# Patient Record
Sex: Female | Born: 1965 | Race: White | Hispanic: No | Marital: Married | Smoking: Never smoker
Health system: Southern US, Community
[De-identification: ages and names within clinical notes are randomized; demographics above are authoritative.]

## PROBLEM LIST (undated history)

## (undated) DIAGNOSIS — I10 Essential (primary) hypertension: Secondary | ICD-10-CM

## (undated) DIAGNOSIS — M549 Dorsalgia, unspecified: Secondary | ICD-10-CM

## (undated) DIAGNOSIS — S82409A Unspecified fracture of shaft of unspecified fibula, initial encounter for closed fracture: Secondary | ICD-10-CM

## (undated) DIAGNOSIS — R519 Headache, unspecified: Secondary | ICD-10-CM

## (undated) DIAGNOSIS — D649 Anemia, unspecified: Secondary | ICD-10-CM

## (undated) DIAGNOSIS — N2889 Other specified disorders of kidney and ureter: Secondary | ICD-10-CM

## (undated) DIAGNOSIS — C649 Malignant neoplasm of unspecified kidney, except renal pelvis: Secondary | ICD-10-CM

## (undated) DIAGNOSIS — E785 Hyperlipidemia, unspecified: Secondary | ICD-10-CM

## (undated) DIAGNOSIS — G8929 Other chronic pain: Secondary | ICD-10-CM

## (undated) DIAGNOSIS — K8020 Calculus of gallbladder without cholecystitis without obstruction: Secondary | ICD-10-CM

## (undated) DIAGNOSIS — E039 Hypothyroidism, unspecified: Secondary | ICD-10-CM

## (undated) HISTORY — DX: Anemia, unspecified: D64.9

## (undated) HISTORY — DX: Morbid (severe) obesity due to excess calories: E66.01

## (undated) HISTORY — DX: Essential (primary) hypertension: I10

## (undated) HISTORY — DX: Hyperlipidemia, unspecified: E78.5

## (undated) HISTORY — DX: Malignant neoplasm of unspecified kidney, except renal pelvis: C64.9

---

## 1992-02-24 HISTORY — PX: ECTOPIC PREGNANCY SURGERY: SHX613

## 1996-02-24 HISTORY — PX: TUBAL LIGATION: SHX77

## 2008-01-30 ENCOUNTER — Other Ambulatory Visit: Admission: RE | Admit: 2008-01-30 | Discharge: 2008-01-30 | Payer: Self-pay | Admitting: Unknown Physician Specialty

## 2008-02-08 ENCOUNTER — Ambulatory Visit (HOSPITAL_COMMUNITY): Admission: RE | Admit: 2008-02-08 | Discharge: 2008-02-08 | Payer: Self-pay | Admitting: Internal Medicine

## 2009-02-19 ENCOUNTER — Other Ambulatory Visit: Admission: RE | Admit: 2009-02-19 | Discharge: 2009-02-19 | Payer: Self-pay | Admitting: Internal Medicine

## 2010-11-25 ENCOUNTER — Other Ambulatory Visit: Payer: Self-pay | Admitting: Family Medicine

## 2010-11-25 DIAGNOSIS — Z139 Encounter for screening, unspecified: Secondary | ICD-10-CM

## 2010-12-09 ENCOUNTER — Ambulatory Visit (HOSPITAL_COMMUNITY): Payer: Self-pay

## 2010-12-11 ENCOUNTER — Ambulatory Visit (HOSPITAL_COMMUNITY)
Admission: RE | Admit: 2010-12-11 | Discharge: 2010-12-11 | Disposition: A | Discharge status: Home / Self Care | Payer: BC Managed Care – PPO | Source: Ambulatory Visit | Attending: Family Medicine | Admitting: Family Medicine

## 2010-12-11 DIAGNOSIS — Z1231 Encounter for screening mammogram for malignant neoplasm of breast: Secondary | ICD-10-CM | POA: Insufficient documentation

## 2010-12-11 DIAGNOSIS — Z139 Encounter for screening, unspecified: Secondary | ICD-10-CM

## 2011-02-24 DIAGNOSIS — C649 Malignant neoplasm of unspecified kidney, except renal pelvis: Secondary | ICD-10-CM

## 2011-02-24 HISTORY — DX: Malignant neoplasm of unspecified kidney, except renal pelvis: C64.9

## 2011-04-28 ENCOUNTER — Other Ambulatory Visit: Payer: Self-pay | Admitting: Family Medicine

## 2011-05-01 ENCOUNTER — Ambulatory Visit (HOSPITAL_COMMUNITY)
Admission: RE | Admit: 2011-05-01 | Discharge: 2011-05-01 | Disposition: A | Discharge status: Home / Self Care | Payer: BC Managed Care – PPO | Source: Ambulatory Visit | Attending: Family Medicine | Admitting: Family Medicine

## 2011-05-01 DIAGNOSIS — R109 Unspecified abdominal pain: Secondary | ICD-10-CM | POA: Insufficient documentation

## 2011-05-01 DIAGNOSIS — N289 Disorder of kidney and ureter, unspecified: Secondary | ICD-10-CM | POA: Insufficient documentation

## 2011-05-01 DIAGNOSIS — K8020 Calculus of gallbladder without cholecystitis without obstruction: Secondary | ICD-10-CM | POA: Insufficient documentation

## 2011-05-01 DIAGNOSIS — R160 Hepatomegaly, not elsewhere classified: Secondary | ICD-10-CM | POA: Insufficient documentation

## 2011-05-01 DIAGNOSIS — D3000 Benign neoplasm of unspecified kidney: Secondary | ICD-10-CM | POA: Insufficient documentation

## 2011-05-01 MED ORDER — GADOBENATE DIMEGLUMINE 529 MG/ML IV SOLN: 20.0000 mL | Freq: Once | INTRAVENOUS | Status: AC | PRN

## 2011-05-13 ENCOUNTER — Ambulatory Visit (INDEPENDENT_AMBULATORY_CARE_PROVIDER_SITE_OTHER): Payer: BC Managed Care – PPO | Admitting: General Surgery

## 2011-05-13 ENCOUNTER — Encounter (INDEPENDENT_AMBULATORY_CARE_PROVIDER_SITE_OTHER): Payer: Self-pay | Admitting: General Surgery

## 2011-05-13 VITALS — BP 155/87 | HR 80 | Ht 63.0 in | Wt 272.8 lb

## 2011-05-13 DIAGNOSIS — N289 Disorder of kidney and ureter, unspecified: Secondary | ICD-10-CM

## 2011-05-13 DIAGNOSIS — N2889 Other specified disorders of kidney and ureter: Secondary | ICD-10-CM | POA: Insufficient documentation

## 2011-05-13 DIAGNOSIS — E119 Type 2 diabetes mellitus without complications: Secondary | ICD-10-CM

## 2011-05-13 DIAGNOSIS — I10 Essential (primary) hypertension: Secondary | ICD-10-CM

## 2011-05-13 DIAGNOSIS — K8020 Calculus of gallbladder without cholecystitis without obstruction: Secondary | ICD-10-CM

## 2011-05-13 DIAGNOSIS — E669 Obesity, unspecified: Secondary | ICD-10-CM | POA: Insufficient documentation

## 2011-05-13 NOTE — Progress Notes (Signed)
Patient ID: Alicia Horne, female   DOB: Apr 09, 1965, 46 y.o.   MRN: [id number]  Chief Complaint  Patient presents with  . Pre-op Exam    eval gallbladder with stones    HPI Alicia Horne is a 46 y.o. female.   HPI  She is referred by Dr. Gerda Diss for evaluation of abdominal pain, nausea, and gallstones. She has had gallstones for at least 21 years. Recently, she's had 2 episodes of pressure-type epigastric pain which radiated to her right subscapular region associated with nausea. These were self-limited. She went to the emergency department and a CT scan was performed. Gallstones were noted but there is no evidence of cholecystitis. A left kidney mass was noted. An ultrasound was performed demonstrating cholelithiasis. Common bile duct was not dilated. Liver was mildly enlarged. An MRI was performed to look at the left kidney mass. This demonstrated the gallstones as well as some fatty liver type changes. A left kidney mass was concerning for renal cell carcinoma. She is due to see a urologist for this next week.  Past Medical History  Diagnosis Date  . Anemia   . Diabetes mellitus   . Hyperlipidemia   . Hypertension     Past Surgical History  Procedure Date  . Cesarean section 1992 and 1998  . Tubal ligation 1998  . Ectopic pregnancy surgery 1994    History reviewed. No pertinent family history.  Social History History  Substance Use Topics  . Smoking status: Never Smoker   . Smokeless tobacco: Not on file  . Alcohol Use: No    No Known Allergies  Current Outpatient Prescriptions  Medication Sig Dispense Refill  . BAYER CONTOUR TEST test strip       . fish oil-omega-3 fatty acids 1000 MG capsule Take 2 g by mouth daily.      Marland Kitchen glimepiride (AMARYL) 4 MG tablet       . HYDROcodone-ibuprofen (VICOPROFEN) 7.5-200 MG per tablet       . levothyroxine (SYNTHROID, LEVOTHROID) 125 MCG tablet       . lisinopril (PRINIVIL,ZESTRIL) 20 MG tablet       . metFORMIN (GLUCOPHAGE-XR)  500 MG 24 hr tablet       . Multiple Vitamins-Minerals (MULTIVITAMIN WITH MINERALS) tablet Take 1 tablet by mouth daily.        Review of Systems Review of Systems  Constitutional: Negative.   HENT: Negative.   Respiratory: Negative.   Cardiovascular: Negative.   Gastrointestinal: Positive for nausea, abdominal pain and diarrhea.  Genitourinary: Negative.   Hematological: Negative.     Blood pressure 155/87, pulse 80, height 5\' 3"  (1.6 m), weight 272 lb 12.8 oz (123.741 kg), SpO2 97.00%.  Physical Exam Physical Exam  Constitutional:       Obese female in no acute distress.  HENT:  Head: Normocephalic and atraumatic.  Eyes: Conjunctivae are normal. No scleral icterus.  Neck: Neck supple.  Cardiovascular: Normal rate and regular rhythm.   Pulmonary/Chest: Effort normal and breath sounds normal.  Abdominal: Soft. She exhibits no distension and no mass. There is tenderness (mild in RUQ).  Musculoskeletal: She exhibits no edema.  Lymphadenopathy:    She has no cervical adenopathy.  Neurological: She is alert.  Skin: Skin is warm and dry.    Data Reviewed MRI, [place], Dr. Fletcher Anon notes.  Assessment    Symptomatic cholelithiasis. Also has a left kidney mass that is concerning for renal cell carcinoma.    Plan    We discussed  laparoscopic cholecystectomy. I told her that if she had to have surgery on her left kidney we could potentially do this at the same time.  I have explained the procedure, risks, and aftercare of cholecystectomy.  Risks include but are not limited to bleeding, infection, wound problems, anesthesia, diarrhea, bile leak, injury to common bile duct/liver/intestine.  She seems to understand.  I have asked her to call me or have a urologist call me guarding the plan for her left kidney mass.          Alicia Horne 05/13/2011, 11:21 AM

## 2011-05-13 NOTE — Patient Instructions (Signed)
Strict low-fat diet. Please let us know what you and the urologist decide regarding the kidney mass.

## 2011-05-25 ENCOUNTER — Other Ambulatory Visit (HOSPITAL_COMMUNITY): Payer: Self-pay | Admitting: Urology

## 2011-05-25 ENCOUNTER — Ambulatory Visit (HOSPITAL_COMMUNITY)
Admission: RE | Admit: 2011-05-25 | Discharge: 2011-05-25 | Disposition: A | Discharge status: Home / Self Care | Payer: BC Managed Care – PPO | Source: Ambulatory Visit | Attending: Urology | Admitting: Urology

## 2011-05-25 DIAGNOSIS — D4959 Neoplasm of unspecified behavior of other genitourinary organ: Secondary | ICD-10-CM

## 2011-05-25 DIAGNOSIS — Z01818 Encounter for other preprocedural examination: Secondary | ICD-10-CM | POA: Insufficient documentation

## 2011-05-25 DIAGNOSIS — N926 Irregular menstruation, unspecified: Secondary | ICD-10-CM | POA: Insufficient documentation

## 2011-06-01 ENCOUNTER — Other Ambulatory Visit (INDEPENDENT_AMBULATORY_CARE_PROVIDER_SITE_OTHER): Payer: Self-pay | Admitting: General Surgery

## 2011-06-03 ENCOUNTER — Other Ambulatory Visit: Payer: Self-pay | Admitting: Urology

## 2011-06-24 ENCOUNTER — Encounter (HOSPITAL_COMMUNITY): Payer: Self-pay

## 2011-06-25 ENCOUNTER — Encounter (HOSPITAL_COMMUNITY): Payer: Self-pay

## 2011-06-25 ENCOUNTER — Encounter (HOSPITAL_COMMUNITY)
Admission: RE | Admit: 2011-06-25 | Discharge: 2011-06-25 | Disposition: A | Discharge status: Home / Self Care | Payer: BC Managed Care – PPO | Source: Ambulatory Visit | Attending: Urology | Admitting: Urology

## 2011-06-25 HISTORY — DX: Dorsalgia, unspecified: M54.9

## 2011-06-25 HISTORY — DX: Other chronic pain: G89.29

## 2011-06-25 HISTORY — DX: Other specified disorders of kidney and ureter: N28.89

## 2011-06-25 HISTORY — DX: Calculus of gallbladder without cholecystitis without obstruction: K80.20

## 2011-06-25 HISTORY — DX: Hypothyroidism, unspecified: E03.9

## 2011-06-25 LAB — COMPREHENSIVE METABOLIC PANEL
Albumin: 3.4 g/dL — ABNORMAL LOW (ref 3.5–5.2)
Alkaline Phosphatase: 74 U/L (ref 39–117)
BUN: 11 mg/dL (ref 6–23)
Calcium: 9 mg/dL (ref 8.4–10.5)
Potassium: 3.8 mEq/L (ref 3.5–5.1)
Sodium: 136 mEq/L (ref 135–145)
Total Protein: 7.1 g/dL (ref 6.0–8.3)

## 2011-06-25 LAB — HCG, SERUM, QUALITATIVE: Preg, Serum: NEGATIVE

## 2011-06-25 LAB — CBC
HCT: 36.6 % (ref 36.0–46.0)
MCH: 27.3 pg (ref 26.0–34.0)
MCHC: 33.9 g/dL (ref 30.0–36.0)
RDW: 14.2 % (ref 11.5–15.5)

## 2011-06-25 LAB — PROTIME-INR: Prothrombin Time: 13.9 seconds (ref 11.6–15.2)

## 2011-06-25 NOTE — Patient Instructions (Signed)
20 Alicia Horne  06/25/2011   Your procedure is scheduled on:  07/03/11  Report to SHORT STAY DEPT  at 8:30 AM.  Call this number if you have problems the morning of surgery: [phone]   Remember:   Do not eat food or drink liquids AFTER MIDNIGHT    Take these medicines the morning of surgery with A SIP OF WATER: LEVOTHYROXINE   Do not wear jewelry, make-up or nail polish.  Do not wear lotions, powders, or perfumes.   Do not shave legs or underarms 48 hrs. before surgery (men may shave face)  Do not bring valuables to the hospital.  Contacts, dentures or bridgework may not be worn into surgery.  Leave suitcase in the car. After surgery it may be brought to your room.  For patients admitted to the hospital, checkout time is 11:00 AM the day of discharge.   Patients discharged the day of surgery will not be allowed to drive home.    Special Instructions:   Please read over the following fact sheets that you were given: MRSA  Information               SHOWER WITH BETASEPT THE NIGHT BEFORE SURGERY AND THE MORNING OF SURGERY

## 2011-07-02 ENCOUNTER — Encounter (INDEPENDENT_AMBULATORY_CARE_PROVIDER_SITE_OTHER): Payer: Self-pay

## 2011-07-03 ENCOUNTER — Encounter (HOSPITAL_COMMUNITY): Payer: Self-pay | Admitting: *Deleted

## 2011-07-03 ENCOUNTER — Encounter (HOSPITAL_COMMUNITY)
Admission: RE | Disposition: A | Discharge status: Home / Self Care | Payer: Self-pay | Source: Ambulatory Visit | Attending: Urology

## 2011-07-03 ENCOUNTER — Inpatient Hospital Stay (HOSPITAL_COMMUNITY)
Admission: RE | Admit: 2011-07-03 | Discharge: 2011-07-07 | DRG: 303 | Disposition: A | Discharge status: Home / Self Care | Payer: BC Managed Care – PPO | Source: Ambulatory Visit | Attending: Urology | Admitting: Urology

## 2011-07-03 ENCOUNTER — Encounter (HOSPITAL_COMMUNITY): Payer: Self-pay | Admitting: Anesthesiology

## 2011-07-03 ENCOUNTER — Ambulatory Visit (HOSPITAL_COMMUNITY): Payer: BC Managed Care – PPO

## 2011-07-03 ENCOUNTER — Ambulatory Visit (HOSPITAL_COMMUNITY): Payer: BC Managed Care – PPO | Admitting: Anesthesiology

## 2011-07-03 DIAGNOSIS — K7689 Other specified diseases of liver: Secondary | ICD-10-CM | POA: Diagnosis present

## 2011-07-03 DIAGNOSIS — E039 Hypothyroidism, unspecified: Secondary | ICD-10-CM | POA: Diagnosis present

## 2011-07-03 DIAGNOSIS — B373 Candidiasis of vulva and vagina: Secondary | ICD-10-CM | POA: Diagnosis not present

## 2011-07-03 DIAGNOSIS — E785 Hyperlipidemia, unspecified: Secondary | ICD-10-CM | POA: Diagnosis present

## 2011-07-03 DIAGNOSIS — N2889 Other specified disorders of kidney and ureter: Secondary | ICD-10-CM | POA: Diagnosis present

## 2011-07-03 DIAGNOSIS — B3731 Acute candidiasis of vulva and vagina: Secondary | ICD-10-CM | POA: Diagnosis not present

## 2011-07-03 DIAGNOSIS — E119 Type 2 diabetes mellitus without complications: Secondary | ICD-10-CM | POA: Diagnosis present

## 2011-07-03 DIAGNOSIS — B009 Herpesviral infection, unspecified: Secondary | ICD-10-CM | POA: Diagnosis not present

## 2011-07-03 DIAGNOSIS — K560 Paralytic ileus: Secondary | ICD-10-CM | POA: Diagnosis not present

## 2011-07-03 DIAGNOSIS — I10 Essential (primary) hypertension: Secondary | ICD-10-CM | POA: Diagnosis present

## 2011-07-03 DIAGNOSIS — K8020 Calculus of gallbladder without cholecystitis without obstruction: Secondary | ICD-10-CM | POA: Diagnosis present

## 2011-07-03 DIAGNOSIS — K660 Peritoneal adhesions (postprocedural) (postinfection): Secondary | ICD-10-CM | POA: Diagnosis present

## 2011-07-03 DIAGNOSIS — C649 Malignant neoplasm of unspecified kidney, except renal pelvis: Principal | ICD-10-CM | POA: Diagnosis present

## 2011-07-03 DIAGNOSIS — K8010 Calculus of gallbladder with chronic cholecystitis without obstruction: Secondary | ICD-10-CM

## 2011-07-03 HISTORY — PX: PARTIAL NEPHRECTOMY: SHX414

## 2011-07-03 HISTORY — PX: CHOLECYSTECTOMY: SHX55

## 2011-07-03 LAB — TYPE AND SCREEN
ABO/RH(D): A POS
Antibody Screen: NEGATIVE

## 2011-07-03 LAB — BASIC METABOLIC PANEL
BUN: 10 mg/dL (ref 6–23)
CO2: 21 mEq/L (ref 19–32)
Calcium: 8.3 mg/dL — ABNORMAL LOW (ref 8.4–10.5)
Creatinine, Ser: 0.77 mg/dL (ref 0.50–1.10)

## 2011-07-03 LAB — GLUCOSE, CAPILLARY: Glucose-Capillary: 228 mg/dL — ABNORMAL HIGH (ref 70–99)

## 2011-07-03 LAB — HEMOGLOBIN AND HEMATOCRIT, BLOOD: Hemoglobin: 12 g/dL (ref 12.0–15.0)

## 2011-07-03 SURGERY — ROBOTIC ASSITED PARTIAL NEPHRECTOMY
Anesthesia: General | Site: Abdomen | Wound class: Clean Contaminated

## 2011-07-03 MED ORDER — SIMVASTATIN 20 MG PO TABS
20.0000 mg | ORAL_TABLET | Freq: Every day | ORAL | Status: DC
  Administered 2011-07-04 – 2011-07-06 (×3): 20 mg via ORAL
  Filled 2011-07-03 (×4): qty 1

## 2011-07-03 MED ORDER — NEOSTIGMINE METHYLSULFATE 1 MG/ML IJ SOLN
INTRAMUSCULAR | Status: DC | PRN
  Administered 2011-07-03: 5 mg via INTRAVENOUS

## 2011-07-03 MED ORDER — GLIMEPIRIDE 4 MG PO TABS
4.0000 mg | ORAL_TABLET | Freq: Every day | ORAL | Status: DC
  Administered 2011-07-04 – 2011-07-07 (×4): 4 mg via ORAL
  Filled 2011-07-03 (×7): qty 1

## 2011-07-03 MED ORDER — BUPIVACAINE-EPINEPHRINE 0.25% -1:200000 IJ SOLN
INTRAMUSCULAR | Status: AC
  Filled 2011-07-03: qty 1

## 2011-07-03 MED ORDER — INSULIN ASPART 100 UNIT/ML ~~LOC~~ SOLN
SUBCUTANEOUS | Status: AC
  Administered 2011-07-03: 5 [IU] via SUBCUTANEOUS
  Filled 2011-07-03: qty 1

## 2011-07-03 MED ORDER — METFORMIN HCL ER 500 MG PO TB24
500.0000 mg | ORAL_TABLET | Freq: Every day | ORAL | Status: DC
  Administered 2011-07-04 – 2011-07-07 (×4): 500 mg via ORAL
  Filled 2011-07-03 (×5): qty 1

## 2011-07-03 MED ORDER — SODIUM CHLORIDE 0.9 % IV SOLN
INTRAVENOUS | Status: DC
  Administered 2011-07-03 – 2011-07-07 (×4): via INTRAVENOUS

## 2011-07-03 MED ORDER — CISATRACURIUM BESYLATE (PF) 10 MG/5ML IV SOLN
INTRAVENOUS | Status: DC | PRN
  Administered 2011-07-03 (×2): 4 mg via INTRAVENOUS
  Administered 2011-07-03: 8 mg via INTRAVENOUS
  Administered 2011-07-03: 4 mg via INTRAVENOUS
  Administered 2011-07-03: 10 mg via INTRAVENOUS
  Administered 2011-07-03: 12 mg via INTRAVENOUS
  Administered 2011-07-03: 8 mg via INTRAVENOUS
  Administered 2011-07-03: 4 mg via INTRAVENOUS

## 2011-07-03 MED ORDER — MORPHINE SULFATE 2 MG/ML IJ SOLN
2.0000 mg | INTRAMUSCULAR | Status: DC | PRN
  Administered 2011-07-03: 2 mg via INTRAVENOUS
  Administered 2011-07-04 – 2011-07-05 (×14): 4 mg via INTRAVENOUS
  Administered 2011-07-06: 2 mg via INTRAVENOUS
  Administered 2011-07-06: 4 mg via INTRAVENOUS
  Filled 2011-07-03 (×10): qty 2
  Filled 2011-07-03: qty 1
  Filled 2011-07-03: qty 2
  Filled 2011-07-03: qty 1
  Filled 2011-07-03 (×2): qty 2
  Filled 2011-07-03: qty 1
  Filled 2011-07-03: qty 2
  Filled 2011-07-03: qty 1
  Filled 2011-07-03: qty 2

## 2011-07-03 MED ORDER — BUPIVACAINE LIPOSOME 1.3 % IJ SUSP
20.0000 mL | INTRAMUSCULAR | Status: AC
  Administered 2011-07-03: 20 mL
  Filled 2011-07-03: qty 20

## 2011-07-03 MED ORDER — PROPOFOL 10 MG/ML IV EMUL
INTRAVENOUS | Status: DC | PRN
  Administered 2011-07-03: 50 mg via INTRAVENOUS
  Administered 2011-07-03: 150 mg via INTRAVENOUS

## 2011-07-03 MED ORDER — ONDANSETRON HCL 4 MG/2ML IJ SOLN
INTRAMUSCULAR | Status: DC | PRN
  Administered 2011-07-03: 4 mg via INTRAVENOUS

## 2011-07-03 MED ORDER — BUPIVACAINE-EPINEPHRINE 0.25% -1:200000 IJ SOLN
INTRAMUSCULAR | Status: DC | PRN
  Administered 2011-07-03: 20 mL

## 2011-07-03 MED ORDER — FIBRIN SEALANT COMPONENT 5 ML EX KIT
PACK | CUTANEOUS | Status: AC
  Filled 2011-07-03: qty 2

## 2011-07-03 MED ORDER — ONDANSETRON HCL 4 MG/2ML IJ SOLN
4.0000 mg | INTRAMUSCULAR | Status: DC | PRN
  Administered 2011-07-03 – 2011-07-05 (×6): 4 mg via INTRAVENOUS
  Filled 2011-07-03 (×6): qty 2

## 2011-07-03 MED ORDER — MANNITOL 25 % IV SOLN
INTRAVENOUS | Status: DC | PRN
  Administered 2011-07-03 (×2): 12.5 g via INTRAVENOUS

## 2011-07-03 MED ORDER — LACTATED RINGERS IV SOLN: INTRAVENOUS | Status: DC

## 2011-07-03 MED ORDER — ACETAMINOPHEN 10 MG/ML IV SOLN
1000.0000 mg | Freq: Four times a day (QID) | INTRAVENOUS | Status: AC
  Administered 2011-07-04 (×4): 1000 mg via INTRAVENOUS
  Filled 2011-07-03 (×4): qty 100

## 2011-07-03 MED ORDER — HYDROMORPHONE HCL PF 1 MG/ML IJ SOLN: 0.2500 mg | INTRAMUSCULAR | Status: DC | PRN

## 2011-07-03 MED ORDER — LIDOCAINE HCL (CARDIAC) 20 MG/ML IV SOLN
INTRAVENOUS | Status: DC | PRN
  Administered 2011-07-03: 100 mg via INTRAVENOUS

## 2011-07-03 MED ORDER — CEFAZOLIN SODIUM 1-5 GM-% IV SOLN
1.0000 g | Freq: Three times a day (TID) | INTRAVENOUS | Status: AC
  Administered 2011-07-03 – 2011-07-04 (×2): 1 g via INTRAVENOUS
  Filled 2011-07-03 (×2): qty 50

## 2011-07-03 MED ORDER — INDIGOTINDISULFONATE SODIUM 8 MG/ML IJ SOLN
INTRAMUSCULAR | Status: AC
  Filled 2011-07-03: qty 5

## 2011-07-03 MED ORDER — FENTANYL CITRATE 0.05 MG/ML IJ SOLN
INTRAMUSCULAR | Status: DC | PRN
  Administered 2011-07-03 (×3): 50 ug via INTRAVENOUS
  Administered 2011-07-03: 100 ug via INTRAVENOUS
  Administered 2011-07-03 (×2): 50 ug via INTRAVENOUS

## 2011-07-03 MED ORDER — HYDRALAZINE HCL 20 MG/ML IJ SOLN
INTRAMUSCULAR | Status: DC | PRN
  Administered 2011-07-03 (×2): 4 mg via INTRAVENOUS

## 2011-07-03 MED ORDER — CEFAZOLIN SODIUM-DEXTROSE 2-3 GM-% IV SOLR
INTRAVENOUS | Status: AC
  Filled 2011-07-03: qty 50

## 2011-07-03 MED ORDER — LABETALOL HCL 5 MG/ML IV SOLN
INTRAVENOUS | Status: DC | PRN
  Administered 2011-07-03: 5 mg via INTRAVENOUS

## 2011-07-03 MED ORDER — LACTATED RINGERS IV SOLN
INTRAVENOUS | Status: DC | PRN
  Administered 2011-07-03 (×3): via INTRAVENOUS

## 2011-07-03 MED ORDER — ESMOLOL HCL 10 MG/ML IV SOLN
INTRAVENOUS | Status: DC | PRN
  Administered 2011-07-03: 40 mg via INTRAVENOUS
  Administered 2011-07-03 (×2): 20 mg via INTRAVENOUS

## 2011-07-03 MED ORDER — MANNITOL 25 % IV SOLN
INTRAVENOUS | Status: AC
  Filled 2011-07-03: qty 100

## 2011-07-03 MED ORDER — LEVOTHYROXINE SODIUM 125 MCG PO TABS
125.0000 ug | ORAL_TABLET | Freq: Every day | ORAL | Status: DC
  Administered 2011-07-04 – 2011-07-07 (×4): 125 ug via ORAL
  Filled 2011-07-03 (×5): qty 1

## 2011-07-03 MED ORDER — IOHEXOL 300 MG/ML  SOLN
INTRAMUSCULAR | Status: AC
  Filled 2011-07-03: qty 1

## 2011-07-03 MED ORDER — INSULIN ASPART 100 UNIT/ML ~~LOC~~ SOLN: 0.0000 [IU] | SUBCUTANEOUS | Status: DC

## 2011-07-03 MED ORDER — INSULIN ASPART 100 UNIT/ML ~~LOC~~ SOLN
SUBCUTANEOUS | Status: AC
  Filled 2011-07-03: qty 1

## 2011-07-03 MED ORDER — SENNOSIDES-DOCUSATE SODIUM 8.6-50 MG PO TABS
2.0000 | ORAL_TABLET | Freq: Every day | ORAL | Status: DC
  Administered 2011-07-04 – 2011-07-05 (×2): 2 via ORAL
  Filled 2011-07-03 (×4): qty 2

## 2011-07-03 MED ORDER — CEFAZOLIN SODIUM-DEXTROSE 2-3 GM-% IV SOLR
2.0000 g | INTRAVENOUS | Status: AC
  Administered 2011-07-03 (×2): 2 g via INTRAVENOUS

## 2011-07-03 MED ORDER — HYDROMORPHONE HCL PF 1 MG/ML IJ SOLN
INTRAMUSCULAR | Status: DC | PRN
  Administered 2011-07-03: 1 mg via INTRAVENOUS
  Administered 2011-07-03 (×3): 0.5 mg via INTRAVENOUS
  Administered 2011-07-03: 1 mg via INTRAVENOUS
  Administered 2011-07-03: 0.5 mg via INTRAVENOUS

## 2011-07-03 MED ORDER — MIDAZOLAM HCL 5 MG/5ML IJ SOLN
INTRAMUSCULAR | Status: DC | PRN
  Administered 2011-07-03: 2 mg via INTRAVENOUS

## 2011-07-03 MED ORDER — SUFENTANIL CITRATE 50 MCG/ML IV SOLN
INTRAVENOUS | Status: DC | PRN
  Administered 2011-07-03: 15 ug via INTRAVENOUS
  Administered 2011-07-03: 5 ug via INTRAVENOUS
  Administered 2011-07-03: 10 ug via INTRAVENOUS
  Administered 2011-07-03: 5 ug via INTRAVENOUS
  Administered 2011-07-03: 10 ug via INTRAVENOUS
  Administered 2011-07-03: 5 ug via INTRAVENOUS

## 2011-07-03 MED ORDER — LACTATED RINGERS IR SOLN
Status: DC | PRN
  Administered 2011-07-03: 2000 mL

## 2011-07-03 MED ORDER — INSULIN ASPART 100 UNIT/ML ~~LOC~~ SOLN
0.0000 [IU] | SUBCUTANEOUS | Status: DC
  Administered 2011-07-04: 4 [IU] via SUBCUTANEOUS
  Administered 2011-07-04: 3 [IU] via SUBCUTANEOUS
  Administered 2011-07-04: 4 [IU] via SUBCUTANEOUS
  Administered 2011-07-04: 11 [IU] via SUBCUTANEOUS
  Administered 2011-07-04: 2 [IU] via SUBCUTANEOUS
  Administered 2011-07-05 (×2): 4 [IU] via SUBCUTANEOUS
  Administered 2011-07-05: 3 [IU] via SUBCUTANEOUS
  Administered 2011-07-05 (×3): 4 [IU] via SUBCUTANEOUS
  Administered 2011-07-06 (×3): 3 [IU] via SUBCUTANEOUS
  Administered 2011-07-06 (×2): 4 [IU] via SUBCUTANEOUS
  Administered 2011-07-07 (×2): 3 [IU] via SUBCUTANEOUS

## 2011-07-03 MED ORDER — INSULIN ASPART 100 UNIT/ML ~~LOC~~ SOLN
0.0000 [IU] | SUBCUTANEOUS | Status: DC
  Administered 2011-07-03: 5 [IU] via SUBCUTANEOUS
  Administered 2011-07-03: 8 [IU] via SUBCUTANEOUS

## 2011-07-03 MED ORDER — HYDROCODONE-ACETAMINOPHEN 5-325 MG PO TABS
1.0000 | ORAL_TABLET | ORAL | Status: DC | PRN
  Administered 2011-07-05: 2 via ORAL
  Filled 2011-07-03: qty 2

## 2011-07-03 MED ORDER — GLYCOPYRROLATE 0.2 MG/ML IJ SOLN
INTRAMUSCULAR | Status: DC | PRN
  Administered 2011-07-03: .7 mg via INTRAVENOUS

## 2011-07-03 MED ORDER — IOHEXOL 300 MG/ML  SOLN
INTRAMUSCULAR | Status: DC | PRN
  Administered 2011-07-03: 20 mL

## 2011-07-03 SURGICAL SUPPLY — 116 items
ADH SKN CLS APL DERMABOND .7 (GAUZE/BANDAGES/DRESSINGS) ×2
APL ESCP 34 STRL LF DISP (HEMOSTASIS) ×2
APL SKNCLS STERI-STRIP NONHPOA (GAUZE/BANDAGES/DRESSINGS)
APPLICATOR SURGIFLO ENDO (HEMOSTASIS) ×1 IMPLANT
APPLIER CLIP 5 13 M/L LIGAMAX5 (MISCELLANEOUS) ×9
APPLIER CLIP ROT 10 11.4 M/L (STAPLE)
APR CLP MED LRG 11.4X10 (STAPLE)
APR CLP MED LRG 5 ANG JAW (MISCELLANEOUS) ×6
BAG SPEC RTRVL LRG 6X4 10 (ENDOMECHANICALS) ×4
BENZOIN TINCTURE PRP APPL 2/3 (GAUZE/BANDAGES/DRESSINGS) ×2 IMPLANT
CABLE HIGH FREQUENCY MONO STRZ (ELECTRODE) ×1 IMPLANT
CANISTER SUCTION 2500CC (MISCELLANEOUS) ×3 IMPLANT
CANNULA SEAL DVNC (CANNULA) ×6 IMPLANT
CANNULA SEALS DA VINCI (CANNULA) ×3
CHLORAPREP W/TINT 26ML (MISCELLANEOUS) ×7 IMPLANT
CLIP APPLIE 5 13 M/L LIGAMAX5 (MISCELLANEOUS) ×2 IMPLANT
CLIP APPLIE ROT 10 11.4 M/L (STAPLE) IMPLANT
CLIP LIGATING HEM O LOK PURPLE (MISCELLANEOUS) IMPLANT
CLIP LIGATING HEMO O LOK GREEN (MISCELLANEOUS) IMPLANT
CLIP SUT LAPRA TY ABSORB (SUTURE) IMPLANT
CLOTH BEACON ORANGE TIMEOUT ST (SAFETY) ×5 IMPLANT
CORD HIGH FREQUENCY UNIPOLAR (ELECTROSURGICAL) ×2 IMPLANT
CORDS BIPOLAR (ELECTRODE) ×3 IMPLANT
COVER MAYO STAND STRL (DRAPES) ×3 IMPLANT
COVER SURGICAL LIGHT HANDLE (MISCELLANEOUS) ×4 IMPLANT
COVER TIP SHEARS 8 DVNC (MISCELLANEOUS) ×2 IMPLANT
COVER TIP SHEARS 8MM DA VINCI (MISCELLANEOUS) ×1
DECANTER SPIKE VIAL GLASS SM (MISCELLANEOUS) ×4 IMPLANT
DERMABOND ADVANCED (GAUZE/BANDAGES/DRESSINGS) ×1
DERMABOND ADVANCED .7 DNX12 (GAUZE/BANDAGES/DRESSINGS) ×4 IMPLANT
DISSECTOR BLUNT TIP ENDO 5MM (MISCELLANEOUS) ×1 IMPLANT
DRAIN CHANNEL 15F RND FF 3/16 (WOUND CARE) ×3 IMPLANT
DRAPE C-ARM 42X72 X-RAY (DRAPES) ×1 IMPLANT
DRAPE CAMERA CLOSED 9X96 (DRAPES) ×1 IMPLANT
DRAPE INCISE IOBAN 66X45 STRL (DRAPES) ×4 IMPLANT
DRAPE LAPAROSCOPIC ABDOMINAL (DRAPES) ×6 IMPLANT
DRAPE LG THREE QUARTER DISP (DRAPES) ×6 IMPLANT
DRAPE TABLE BACK 44X90 PK DISP (DRAPES) ×3 IMPLANT
DRAPE UTILITY XL STRL (DRAPES) ×3 IMPLANT
DRAPE WARM FLUID 44X44 (DRAPE) ×3 IMPLANT
DRESSING SURGICEL FIBRLLR 1X2 (HEMOSTASIS) ×2 IMPLANT
DRSG SURGICEL FIBRILLAR 1X2 (HEMOSTASIS)
DRSG TEGADERM 2-3/8X2-3/4 SM (GAUZE/BANDAGES/DRESSINGS) ×13 IMPLANT
DRSG TEGADERM 4X4.75 (GAUZE/BANDAGES/DRESSINGS) ×2 IMPLANT
ELECT REM PT RETURN 9FT ADLT (ELECTROSURGICAL) ×6
ELECTRODE REM PT RTRN 9FT ADLT (ELECTROSURGICAL) ×6 IMPLANT
ENDOLOOP SUT PDS II  0 18 (SUTURE)
ENDOLOOP SUT PDS II 0 18 (SUTURE) IMPLANT
EVACUATOR SILICONE 100CC (DRAIN) ×3 IMPLANT
FLOSEAL 10ML (HEMOSTASIS) ×1 IMPLANT
GAUZE VASELINE 3X9 (GAUZE/BANDAGES/DRESSINGS) IMPLANT
GLOVE BIO SURGEON STRL SZ 6.5 (GLOVE) ×3 IMPLANT
GLOVE BIOGEL M STRL SZ7.5 (GLOVE) ×10 IMPLANT
GLOVE BIOGEL PI IND STRL 7.0 (GLOVE) ×2 IMPLANT
GLOVE BIOGEL PI IND STRL 8.5 (GLOVE) IMPLANT
GLOVE BIOGEL PI INDICATOR 7.0 (GLOVE) ×2
GLOVE BIOGEL PI INDICATOR 8.5 (GLOVE) ×2
GLOVE ECLIPSE 8.0 STRL XLNG CF (GLOVE) ×2 IMPLANT
GLOVE INDICATOR 8.0 STRL GRN (GLOVE) ×4 IMPLANT
GLOVE SURG SS PI 6.5 STRL IVOR (GLOVE) ×4 IMPLANT
GOWN STRL NON-REIN LRG LVL3 (GOWN DISPOSABLE) ×22 IMPLANT
GOWN STRL REIN XL XLG (GOWN DISPOSABLE) ×9 IMPLANT
HEMOSTAT SNOW SURGICEL 2X4 (HEMOSTASIS) ×1 IMPLANT
HEMOSTAT SURGICEL 4X8 (HEMOSTASIS) IMPLANT
IV CATH 14GX2 1/4 (CATHETERS) IMPLANT
KIT ACCESSORY DA VINCI DISP (KITS) ×1
KIT ACCESSORY DVNC DISP (KITS) ×2 IMPLANT
KIT BASIN OR (CUSTOM PROCEDURE TRAY) ×5 IMPLANT
NDL SAFETY ECLIPSE 18X1.5 (NEEDLE) IMPLANT
NEEDLE HYPO 18GX1.5 SHARP (NEEDLE) ×3
NS IRRIG 1000ML POUR BTL (IV SOLUTION) ×3 IMPLANT
PENCIL BUTTON HOLSTER BLD 10FT (ELECTRODE) ×3 IMPLANT
POSITIONER SURGICAL ARM (MISCELLANEOUS) ×4 IMPLANT
POUCH SPECIMEN RETRIEVAL 10MM (ENDOMECHANICALS) ×4 IMPLANT
SCISSORS ENDO CVD 5DCS (MISCELLANEOUS) ×1 IMPLANT
SET CHOLANGIOGRAPH MIX (MISCELLANEOUS) ×3 IMPLANT
SET IRRIG TUBING LAPAROSCOPIC (IRRIGATION / IRRIGATOR) ×3 IMPLANT
SET TUBE IRRIG SUCTION NO TIP (IRRIGATION / IRRIGATOR) ×1 IMPLANT
SOLUTION ANTI FOG 6CC (MISCELLANEOUS) ×5 IMPLANT
SOLUTION ELECTROLUBE (MISCELLANEOUS) ×3 IMPLANT
SPONGE GAUZE 4X4 12PLY (GAUZE/BANDAGES/DRESSINGS) ×1 IMPLANT
SPONGE LAP 18X18 X RAY DECT (DISPOSABLE) ×2 IMPLANT
STAPLER VISISTAT 35W (STAPLE) ×2 IMPLANT
STRIP CLOSURE SKIN 1/2X4 (GAUZE/BANDAGES/DRESSINGS) ×2 IMPLANT
SURGIFLO W/THROMBIN 8M KIT (HEMOSTASIS) ×2 IMPLANT
SUT ETHILON 3 0 PS 1 (SUTURE) ×3 IMPLANT
SUT MNCRL AB 4-0 PS2 18 (SUTURE) ×10 IMPLANT
SUT MON AB 2-0 SH 27 (SUTURE) ×2 IMPLANT
SUT MON AB 2-0 SH27 (SUTURE) ×2 IMPLANT
SUT V-LOC BARB 180 2/0GR9 GS23 (SUTURE) ×6
SUT VIC AB 0 CT1 27 (SUTURE) ×3
SUT VIC AB 0 CT1 27XBRD ANTBC (SUTURE) ×4 IMPLANT
SUT VIC AB 0 UR5 27 (SUTURE) ×2 IMPLANT
SUT VIC AB 2-0 SH 27 (SUTURE)
SUT VIC AB 2-0 SH 27X BRD (SUTURE) ×4 IMPLANT
SUT VIC AB 4-0 RB1 27 (SUTURE)
SUT VIC AB 4-0 RB1 27XBRD (SUTURE) ×4 IMPLANT
SUT VICRYL 0 UR6 27IN ABS (SUTURE) ×7 IMPLANT
SUT VLOC BARB 180 ABS3/0GR12 (SUTURE) ×3
SUTURE V-LC BRB 180 2/0GR9GS23 (SUTURE) IMPLANT
SUTURE VLOC BRB 180 ABS3/0GR12 (SUTURE) IMPLANT
SYR 3ML LL SCALE MARK (SYRINGE) ×1 IMPLANT
SYR BULB IRRIGATION 50ML (SYRINGE) IMPLANT
TOWEL OR 17X26 10 PK STRL BLUE (TOWEL DISPOSABLE) ×4 IMPLANT
TOWEL OR NON WOVEN STRL DISP B (DISPOSABLE) ×3 IMPLANT
TRAY FOLEY CATH 14FRSI W/METER (CATHETERS) ×3 IMPLANT
TRAY LAP CHOLE (CUSTOM PROCEDURE TRAY) ×5 IMPLANT
TROCAR BLADELESS OPT 5 100 (ENDOMECHANICALS) ×1 IMPLANT
TROCAR BLADELESS OPT 5 75 (ENDOMECHANICALS) ×9 IMPLANT
TROCAR ENDOPATH XCEL 12X100 BL (ENDOMECHANICALS) ×3 IMPLANT
TROCAR XCEL 12X100 BLDLESS (ENDOMECHANICALS) ×4 IMPLANT
TROCAR XCEL BLUNT TIP 100MML (ENDOMECHANICALS) ×3 IMPLANT
TROCAR XCEL NON-BLD 11X100MML (ENDOMECHANICALS) IMPLANT
TROCAR XCEL NON-BLD 5MMX100MML (ENDOMECHANICALS) ×1 IMPLANT
TUBING INSUFFLATION 10FT LAP (TUBING) ×6 IMPLANT
WATER STERILE IRR 1500ML POUR (IV SOLUTION) ×6 IMPLANT

## 2011-07-03 NOTE — Transfer of Care (Signed)
Immediate Anesthesia Transfer of Care Note  Patient: Alicia Horne  Procedure(s) Performed: Procedure(s) (LRB): ROBOTIC ASSITED PARTIAL NEPHRECTOMY (Left) LAPAROSCOPIC CHOLECYSTECTOMY WITH INTRAOPERATIVE CHOLANGIOGRAM (N/A) OPERATIVE ULTRASOUND (Left)  Patient Location: PACU  Anesthesia Type: General  Level of Consciousness: sedated, patient cooperative and responds to stimulaton  Airway & Oxygen Therapy: Patient Spontanous Breathing and Patient connected to face mask oxgen  Post-op Assessment: Report given to PACU RN and Post -op Vital signs reviewed and stable  Post vital signs: Reviewed and stable  Complications: No apparent anesthesia complications

## 2011-07-03 NOTE — Anesthesia Postprocedure Evaluation (Signed)
  Anesthesia Post-op Note  Patient: Alicia Horne  Procedure(s) Performed: Procedure(s) (LRB): ROBOTIC ASSITED PARTIAL NEPHRECTOMY (Left) LAPAROSCOPIC CHOLECYSTECTOMY WITH INTRAOPERATIVE CHOLANGIOGRAM (N/A) OPERATIVE ULTRASOUND (Left)  Patient Location: PACU  Anesthesia Type: General  Level of Consciousness: awake, alert  and oriented  Airway and Oxygen Therapy: Patient Spontanous Breathing and Patient connected to face mask oxygen  Post-op Pain: none  Post-op Assessment: Post-op Vital signs reviewed, Patient's Cardiovascular Status Stable, Respiratory Function Stable, Patent Airway, No signs of Nausea or vomiting and Pain level controlled  Post-op Vital Signs: Reviewed and stable  Complications: No apparent anesthesia complications

## 2011-07-03 NOTE — Anesthesia Postprocedure Evaluation (Signed)
  Anesthesia Post-op Note  Patient: Alicia Horne  Procedure(s) Performed: Procedure(s) (LRB): ROBOTIC ASSITED PARTIAL NEPHRECTOMY (Left) LAPAROSCOPIC CHOLECYSTECTOMY WITH INTRAOPERATIVE CHOLANGIOGRAM (N/A) OPERATIVE ULTRASOUND (Left)  Patient Location: PACU  Anesthesia Type: General  Level of Consciousness: oriented and sedated  Airway and Oxygen Therapy: Patient Spontanous Breathing and Patient connected to nasal cannula oxygen  Post-op Pain: mild  Post-op Assessment: Post-op Vital signs reviewed, Patient's Cardiovascular Status Stable, Respiratory Function Stable and Patent Airway  Post-op Vital Signs: stable  Complications: No apparent anesthesia complications

## 2011-07-03 NOTE — Progress Notes (Signed)
Pt did bowel prep and clear liq diet 07/02/11

## 2011-07-03 NOTE — Anesthesia Preprocedure Evaluation (Addendum)
Anesthesia Evaluation  Patient identified by MRN, date of birth, ID band Patient awake    Reviewed: Allergy & Precautions, H&P , NPO status , Patient's Chart, lab work & pertinent test results, reviewed documented beta blocker date and time   Airway Mallampati: II TM Distance: >3 FB Neck ROM: Full    Dental  (+) Dental Advisory Given and Teeth Intact   Pulmonary neg pulmonary ROS,  breath sounds clear to auscultation        Cardiovascular hypertension, Pt. on medications Rhythm:Regular Rate:Normal  Denies cardiac sumptoms   Neuro/Psych negative neurological ROS  negative psych ROS   GI/Hepatic Neg liver ROS, gallstones   Endo/Other  Diabetes mellitus-, Poorly Controlled, Type 2, Oral Hypoglycemic AgentsHypothyroidism Morbid obesity  Renal/GU Lf renal mass  negative genitourinary   Musculoskeletal negative musculoskeletal ROS (+)   Abdominal   Peds negative pediatric ROS (+)  Hematology negative hematology ROS (+)   Anesthesia Other Findings   Reproductive/Obstetrics negative OB ROS                          Anesthesia Physical Anesthesia Plan  ASA: III  Anesthesia Plan: General   Post-op Pain Management:    Induction: Intravenous  Airway Management Planned: Oral ETT  Additional Equipment:   Intra-op Plan:   Post-operative Plan: Extubation in OR  Informed Consent: I have reviewed the patients History and Physical, chart, labs and discussed the procedure including the risks, benefits and alternatives for the proposed anesthesia with the patient or authorized representative who has indicated his/her understanding and acceptance.   Dental advisory given  Plan Discussed with: CRNA and Surgeon  Anesthesia Plan Comments:         Anesthesia Quick Evaluation

## 2011-07-03 NOTE — Op Note (Signed)
Preoperative diagnosis:  Symptomatic cholelithiasis Postoperative diagnosis:  Same  Procedure: Laparoscopic cholecystectomy with cholangiogram.  Surgeon: Avel Peace, M.D.  Asst.:  none  Anesthesia: Gen.  Indication:   This is a 46 year old female with known gallstones. Recently she started having fairly classic biliary colic type pain. A CT scan was done which demonstrated gallstones also demonstrated a left kidney tumor. She now presents for laparoscopic cholecystectomy followed by a robotic-assisted partial left nephrectomy by Dr. Margarita Grizzle.  Technique: The patient was brought to the operating room, placed supine on the operating table, and a general anesthetic was administered.  The abdominal wall was then sterilely prepped and draped. Local anesthetic (Marcaine) was infiltrated in the supraumbilical region. A small supraumbilical incision was made through the skin, subcutaneous tissue, fascia, and peritoneum entering the peritoneal cavity under direct vision. A pursestring suture of 0 Vicryl was placed around the edges of the fascia. A Hassan trocar was introduced into the peritoneal cavity and a pneumoperitoneum was created by insufflation of carbon dioxide gas. The laparoscope was introduced into the trocar and no underlying bleeding or organ injury was noted. The patient was then placed in the reverse Trendelenburg position with the right side tilted slightly up.  Three more trochars were then placed into the abdominal cavity under laparoscopic vision. One in the epigastric area, and 2 in the right upper quadrant area. The gallbladder was visualized.  There were adhesions between the omentum and gallbladder which were separated bluntly. The liver demonstrated diffuse fatty infiltration changes.  The fundus was grasped and retracted toward the right shoulder.  The infundibulum was mobilized with dissection close to the gallbladder and retracted laterally. The cystic duct was identified and a  window was created around it. The cystic artery was also identified and a window was created around it. The critical view was achieved. A clip was placed at the neck of the gallbladder. A small incision was made in the cystic duct any small stone fragment was removed from the cystic duct.. A cholangiocatheter was introduced through the anterior abdominal wall and placed in the cystic duct. A intraoperative cholangiogram was then performed.  Under real-time fluoroscopy, dilute contrast was injected into the cystic duct.  The common hepatic duct, the right and left hepatic ducts, and the common duct were all visualized. Contrast drained into the duodenum without obvious evidence of any obstructing ductal lesion. The final report is pending the Radiologist's interpretation.  The cholangiocatheter was removed, the cystic duct was clipped 3 times on the biliary side, and then the cystic duct was divided sharply. No bile leak was noted from the cystic duct stump.  The cystic artery was then clipped and divided. Following this the gallbladder was dissected free from the liver using electrocautery.  A small hole was made in the gallbladder with some spillage of bile but no spillage of stones.  A small accessory vessel was noted going directly from the liver to the fundus and this was clipped on the liver side and divided on the gallbladder side with electrocautery.The gallbladder was then placed in a retrieval bag and removed from the abdominal cavity through the supraumbilical incision.  The gallbladder fossa was inspected, irrigated, and bleeding was controlled with electrocautery. Inspection showed that hemostasis was adequate and there was no evidence of bile leak.  The irrigation fluid was evacuated as much as possible.    There were adhesions between the omentum and left upper quadrant and I divided them sharply to allow for better access for  the left nephrectomy. There were also adhesions between the  omentum and lower abdominal wall which were partially divided sharply as well.  The supraumbilical trocar was removed and the pursestring suture was left untied and placed into the subcutaneous tissue. This was so the incision could be used for the robotic assisted nephrectomy. This skin at this site was then closed with staples.   The remaining trocars were removed and the pneumoperitoneum was released. The 5 mm skin incisions were closed with 4-0 Monocryl subcuticular stitches.Dermabond was applied to these areas  The procedure was well-tolerated without any apparent complications.  Dr. Margarita Grizzle then proceeded with the robotic-assisted left partial nephrectomy.

## 2011-07-03 NOTE — H&P (Signed)
Urology History and Physical Exam  CC: Left renal mass  HPI:   46 year old female with a left renal mass discovered incidentally for abdominal pain February 2013. 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 It is located in the anterior portion. It is 1.5 cm in largest dimension.  MRI of the abdomen May 01, 2011 with and without IV contrast subtle enhancement of the renal mass. It measures 1.3 x 0.8 cm. It is endophytic. It is located in the left anterior inferior portion of the kidney. It appears to abut the collecting system.  The patient has no family history of renal cancer or inheritable cancers that she is aware of. She has no history of gross hematuria or dysuria. She has no recent weight loss. She has a history of back pain it is in the midline due to the bulging disc. She had transient transaminitis from her labs in the ER, but she states her PCP repeated these labs and they are normal.  Dr. Purnell Shoemaker to do laparoscopic cholecystectomy at the same time.   She presents today for left robot assisted labaroscopic partial nephrectomy. We have discussed the risks, benefits, alternatives, and likelihood of achieving her goals. We discussed removal of the AML as well. I have advised against it at this point since this is a benign entity and would prolong her OR time to remove.  PSH: C-section, tubal litagion, ectopic pregnancy.   PMH: Past Medical History  Diagnosis Date  . Anemia   . Diabetes mellitus   . Hyperlipidemia   . Hypertension   . Back pain, chronic   . Gallstones   . Renal mass, left   . Hypothyroidism     PSH: Past Surgical History  Procedure Date  . Cesarean section 1992 and 1998  . Tubal ligation 1998  . Ectopic pregnancy surgery 1994    Allergies: Allergies  Allergen Reactions  . Tape Other (See Comments)    Makes her skin burn    Medications: No prescriptions  prior to admission     Social History: History   Social History  . Marital Status: Married    Spouse Name: N/A    Number of Children: N/A  . Years of Education: N/A   Occupational History  . Not on file.   Social History Main Topics  . Smoking status: Never Smoker   . Smokeless tobacco: Not on file  . Alcohol Use: No  . Drug Use: No  . Sexually Active:    Other Topics Concern  . Not on file   Social History Narrative  . No narrative on file    Family History: No family history on file.  Review of Systems: Positive: None Negative: Fever, chest pain, SOB.  A further 10 point review of systems was negative except what is listed in the HPI.  Physical Exam:  General: No acute distress.  Awake. Head:  Normocephalic.  Atraumatic. ENT:  EOMI.  Mucous membranes moist Neck:  Supple.  No lymphadenopathy. CV:  S1 present. S2 present. Regular rate. Pulmonary: Equal effort bilaterally.  Clear to auscultation bilaterally. Abdomen: Soft.  Non- tender to palpation. Skin:  Normal turgor.  No visible rash. Extremity: No gross deformity of bilateral upper extremities.  No gross deformity of    bilateral lower extremities. Neurologic: Alert. Appropriate mood.   Studies:  No results found for this basename: HGB:2,WBC:2,PLT:2 in the last  72 hours  No results found for this basename: NA:2,K:2,CL:2,CO2:2,BUN:2,CREATININE:2,CALCIUM:2,MAGNESIUM:2,GFRNONAA:2,GFRAA:2 in the last 72 hours   No results found for this basename: PT:2,INR:2,APTT:2 in the last 72 hours   No components found with this basename: ABG:2    Assessment:  Left renal mass  Plan: -To OR for left robot assisted laparoscopic partial nephrectomy. -Dr. Abbey Chatters to complete laparoscopic cholecystectomy.

## 2011-07-03 NOTE — Discharge Instructions (Addendum)
DISCHARGE INSTRUCTIONS FOR PARTIAL LAPAROSCOPIC NEPHRECTOMY  MEDICATIONS:   1. DO NOT RESUME YOUR ASPIRIN, or any other medicines like ibuprofen, motrin,  excedrin, advil, aleve, vitamin E, fish oil as these can all cause bleeding for one week.  2. Resume all your other meds from home - except do not take any other pain meds  that you may have at home.  ACTIVITY 1. No heavy lifting >10 pounds for 8 weeks 2. No sexual activity for 8 weeks 3. No strenuous activity for 8 weeks 4. No driving while on narcotic pain medications 5. Drink plenty of water 6. Continue to walk at home - you can still get blood clots when you are at home,  so keep active, but don't over do it. 7. You may resume normal activity in 2 wks (working but no heavy lifting)  BATHING 1. You can shower and we recommend daily showers.  If you have steri strips over your  incision they will fall off on their own in a week or so.  If you have staples, they will be removed in 1-2 weeks.  2. The JP site will close up on its own in a few days - replace the 4x4 gauze if  it becomes saturated.  SIGNS/SYMPTOMS TO CALL: 1. Please call us if you have a fever greater than 101.5, uncontrolled  nausea/vomiting, uncontrolled pain, dizziness, unable to urinate, bloody urine,  chest pain, shortness of breath, leg swelling, leg pain, or any other concerns or  questions.  You can reach [place] at [phone].      CCS ______[facility] LAPAROSCOPIC SURGERY: POST OP INSTRUCTIONS Always review your discharge instruction sheet given to you by the facility where your surgery was performed. IF YOU HAVE DISABILITY OR FAMILY LEAVE FORMS, YOU MUST BRING THEM TO THE OFFICE FOR PROCESSING.   DO NOT GIVE THEM TO YOUR DOCTOR.  1. A prescription for pain medication may be given to you upon discharge.  Take your pain medication as prescribed, if needed.  If narcotic pain medicine is not needed, then you may take acetaminophen  (Tylenol) or ibuprofen (Advil) as needed. 2. Take your usually prescribed medications unless otherwise directed. 3. If you need a refill on your pain medication, please contact your pharmacy.  They will contact our office to request authorization. Prescriptions will not be filled after 5pm or on week-ends. 4. You should follow a lowfat diet, lifelong. 5. Most patients will experience some swelling and bruising in the area of the incisions.  Ice packs will help.  Swelling and bruising can take several days to resolve.  6. It is common to experience some constipation if taking pain medication after surgery.  Increasing fluid intake and taking a stool softener (such as Colace) will usually help or prevent this problem from occurring.  A mild laxative (Milk of Magnesia or Miralax) should be taken according to package instructions if there are no bowel movements after 48 hours. 7. Unless discharge instructions indicate otherwise, you may remove your bandages 24-48 hours after surgery, and you may shower at that time.  You may have steri-strips (small skin tapes) in place directly over the incision.  These strips should be left on the skin for 7-10 days.  If your surgeon used skin glue on the incision, you may shower in 24 hours.  The glue will flake off over the next 2-3 weeks.  Any sutures or staples will be removed at the office during your follow-up visit. 8. ACTIVITIES:  You may resume regular (light) daily activities beginning the next day--such as daily self-care, walking, climbing stairs--gradually increasing activities as tolerated.  You may have sexual intercourse when it is comfortable.  Refrain from any heavy lifting or straining until approved by your doctor. a. You may drive when you are no longer taking prescription pain medication, you can comfortably wear a seatbelt, and you can safely maneuver your car and apply brakes. b. RETURN TO WORK:  _____When released by  MD._____________________________________________________ 9. You should see your doctor in the office for a follow-up appointment approximately 2-3 weeks after your surgery.  Make sure that you call for this appointment within a day or two after you arrive home to insure a convenient appointment time. 10. OTHER INSTRUCTIONS: __________________________________________________________________________________________________________________________ __________________________________________________________________________________________________________________________ WHEN TO CALL YOUR DOCTOR: 1. Fever over 101.5 2. Inability to urinate 3. Continued bleeding from incision. 4. Increased pain, redness, or drainage from the incision. 5. Increasing abdominal pain  The clinic staff is available to answer your questions during regular business hours.  Please don't hesitate to call and ask to speak to one of the nurses for clinical concerns.  If you have a medical emergency, go to the nearest emergency room or call 911.  A surgeon from [facility] is always on call at the hospital. [address], [place], [place]  [address] ? [address] [address], [place], [place]   [address] [phone] ? [phone] ? FAX [phone] Web site: [email or web address]

## 2011-07-03 NOTE — Brief Op Note (Signed)
07/03/2011  7:36 PM  PATIENT:  Alicia Horne  46 y.o. female  PRE-OPERATIVE DIAGNOSIS:  left renal mass/gallstones   POST-OPERATIVE DIAGNOSIS:  left renal mass/gallstones  PROCEDURE:  Procedure(s) (LRB): ROBOTIC ASSITED PARTIAL NEPHRECTOMY (Left) LAPAROSCOPIC CHOLECYSTECTOMY WITH INTRAOPERATIVE CHOLANGIOGRAM (N/A) OPERATIVE ULTRASOUND (Left)  SURGEON:  Surgeon(s) and Role: Panel 1:    * Crecencio Mc, MD - Assisting    * Milford Cage, MD - Primary  Panel 2:    * Adolph Pollack, MD - Primary  PHYSICIAN ASSISTANT:   ASSISTANTS: Pecola Leisure, PA   ANESTHESIA:   general  EBL:  Total I/O In: 1000 [I.V.:1000] Out: -   BLOOD ADMINISTERED:none  DRAINS: (1) Jackson-Pratt drain(s) with closed bulb suction in the LLQ and Urinary Catheter (Foley)   LOCAL MEDICATIONS USED:  Amount: OTHER Exparel 266mg   SPECIMEN:  Source of Specimen:  left kidney  DISPOSITION OF SPECIMEN:  PATHOLOGY  COUNTS:  YES  TOURNIQUET:  * No tourniquets in log *  DICTATION: .Other Dictation: Dictation Number [id number]  PLAN OF CARE: Admit to inpatient   PATIENT DISPOSITION:  PACU - hemodynamically stable.   Delay start of Pharmacological VTE agent (>24hrs) due to surgical blood loss or risk of bleeding: yes

## 2011-07-03 NOTE — OR Nursing (Signed)
Left renal vein clamped from [phone]

## 2011-07-04 ENCOUNTER — Encounter (HOSPITAL_COMMUNITY): Payer: Self-pay | Admitting: *Deleted

## 2011-07-04 LAB — CBC
HCT: 34.5 % — ABNORMAL LOW (ref 36.0–46.0)
Hemoglobin: 11.3 g/dL — ABNORMAL LOW (ref 12.0–15.0)
MCV: 82.3 fL (ref 78.0–100.0)
RBC: 4.19 MIL/uL (ref 3.87–5.11)
WBC: 11.2 10*3/uL — ABNORMAL HIGH (ref 4.0–10.5)

## 2011-07-04 LAB — BASIC METABOLIC PANEL
BUN: 8 mg/dL (ref 6–23)
CO2: 25 mEq/L (ref 19–32)
Chloride: 101 mEq/L (ref 96–112)
Creatinine, Ser: 0.74 mg/dL (ref 0.50–1.10)
Glucose, Bld: 188 mg/dL — ABNORMAL HIGH (ref 70–99)
Potassium: 3.6 mEq/L (ref 3.5–5.1)

## 2011-07-04 LAB — GLUCOSE, CAPILLARY
Glucose-Capillary: 129 mg/dL — ABNORMAL HIGH (ref 70–99)
Glucose-Capillary: 169 mg/dL — ABNORMAL HIGH (ref 70–99)
Glucose-Capillary: 186 mg/dL — ABNORMAL HIGH (ref 70–99)

## 2011-07-04 MED ORDER — DIPHENHYDRAMINE HCL 25 MG PO CAPS
25.0000 mg | ORAL_CAPSULE | Freq: Four times a day (QID) | ORAL | Status: DC | PRN
  Administered 2011-07-04 – 2011-07-06 (×4): 25 mg via ORAL
  Filled 2011-07-04 (×4): qty 1

## 2011-07-04 MED ORDER — BISACODYL 10 MG RE SUPP
10.0000 mg | Freq: Every day | RECTAL | Status: DC | PRN
Start: 2011-07-04 — End: 2011-07-06
  Administered 2011-07-04 – 2011-07-05 (×2): 10 mg via RECTAL
  Filled 2011-07-04 (×2): qty 1

## 2011-07-04 NOTE — Progress Notes (Signed)
1 Day Post-Op Subjective: Patient reports some nausea, but this was improved with a recent dose of Zofran. Her biggest complaints are a lump on her right occiput, a swollen lip and swollen left earlobe.  Objective: Vital signs in last 24 hours: Temp:  [98.1 F (36.7 C)-99.9 F (37.7 C)] 98.4 F (36.9 C) (05/11 0659) Pulse Rate:  [70-86] 72  (05/11 0659) Resp:  [15-25] 16  (05/11 0659) BP: (129-149)/(44-80) 146/69 mmHg (05/11 0659) SpO2:  [94 %-100 %] 95 % (05/11 0659)  Intake/Output from previous day: 05/10 0701 - 05/11 0700 In: 4993.8 [I.V.:4993.8] Out: 2115 [Urine:1960; Drains:30; Blood:125] Intake/Output this shift:    Physical Exam:  Constitutional: Vital signs reviewed. WD WN in NAD   There is edema but no significant ecchymosis of her left earlobe and lower lip. There is mild tenderness and swelling of her right occiput, in an area approximately 15 x 30 mm.  Abdomen is obese. Incisions all appear dry. Minimal bowel sounds noted. Appropriate tenderness.  Lab Results:  Basename 07/04/11 0504 07/03/11 2004  HGB 11.3* 12.0  HCT 34.5* 36.0   BMET  Basename 07/04/11 0504 07/03/11 2004  NA 137 137  K 3.6 4.6  CL 101 102  CO2 25 21  GLUCOSE 188* 265*  BUN 8 10  CREATININE 0.74 0.77  CALCIUM 8.6 8.3*   No results found for this basename: LABPT:3,INR:3 in the last 72 hours No results found for this basename: LABURIN:1 in the last 72 hours Results for orders placed during the hospital encounter of 06/25/11  SURGICAL PCR SCREEN     Status: Normal   Collection Time   06/25/11 10:17 AM      Component Value Range Status Comment   MRSA, PCR NEGATIVE  NEGATIVE  Final    Staphylococcus aureus NEGATIVE  NEGATIVE  Final     Studies/Results: Dg Cholangiogram Operative  07/03/2011  *RADIOLOGY REPORT*  Clinical Data:   Cholelithiasis  INTRAOPERATIVE CHOLANGIOGRAM  Technique:  Cholangiographic images from the C-arm fluoroscopic device were submitted for interpretation  post-operatively.  Please see the procedural report for the amount of contrast and the fluoroscopy time utilized.  Comparison:  None  Findings:  No persistent filling defects in the common duct. Intrahepatic ducts are incompletely visualized, appearing decompressed centrally. Contrast passes into the duodenum.  IMPRESSION  Negative for retained common duct stone.  Original Report Authenticated By: Osa Craver, M.D.    Assessment/Plan:   Postoperative day 1 combined laparoscopic cholecystectomy and robotic-assisted left partial nephrectomy. She is hemodynamically stable. Facial/ scalp swelling is most likely due to long-standing pressure during anesthesia. The should heal up nicely.    She does have some gas cramps. I think this will be alleviated by a Dulcolax suppository.    J-P drainage is minimal, urinary output excellent. We will leave the drain in the Foley catheter at least overnight.    We will keep her on clear liquids, and bed rest.   LOS: 1 day   Chelsea Aus 07/04/2011, 8:51 AM

## 2011-07-04 NOTE — Progress Notes (Signed)
Pt TX from [facility] family at bedside. Alert x4, v/s stable. Pt lower lip is noticeably swollen, no trouble swallowing, red in color, swollen and tender to touch. The  Pt notices a large bump to her head, that was not there before the surgery.  Lf ear lobe appears to be swollen. No c/o pain. Page Md made aware of the pt's sudden swelling episode, no orders given. Ice applied will monitor.

## 2011-07-04 NOTE — Progress Notes (Signed)
Patient ID: Alicia Horne, female   DOB: 1965-04-09, 46 y.o.   MRN: [id number] 1 Day Post-Op  Subjective: Sore, no severe pain, tolerating CL with minimal nausea  Objective: Vital signs in last 24 hours: Temp:  [98 F (36.7 C)-99.9 F (37.7 C)] 98 F (36.7 C) (05/11 1000) Pulse Rate:  [64-86] 64  (05/11 1000) Resp:  [15-25] 16  (05/11 1000) BP: (124-149)/(44-80) 124/79 mmHg (05/11 1000) SpO2:  [94 %-100 %] 96 % (05/11 1000) Last BM Date: 07/03/11  Intake/Output from previous day: 05/10 0701 - 05/11 0700 In: 4993.8 [I.V.:4993.8] Out: 2115 [Urine:1960; Drains:30; Blood:125] Intake/Output this shift: Total I/O In: 240 [P.O.:240] Out: 950 [Urine:900; Drains:50]  General appearance: alert and no distress GI: Mild tenderness in upper abdomen Incision/Wound: Clean and dry  Lab Results:   [facility] 07/04/11 0504 07/03/11 2004  WBC 11.2* --  HGB 11.3* 12.0  HCT 34.5* 36.0  PLT 273 --   BMET  Basename 07/04/11 0504 07/03/11 2004  NA 137 137  K 3.6 4.6  CL 101 102  CO2 25 21  GLUCOSE 188* 265*  BUN 8 10  CREATININE 0.74 0.77  CALCIUM 8.6 8.3*     Studies/Results: Dg Cholangiogram Operative  07/03/2011  *RADIOLOGY REPORT*  Clinical Data:   Cholelithiasis  INTRAOPERATIVE CHOLANGIOGRAM  Technique:  Cholangiographic images from the C-arm fluoroscopic device were submitted for interpretation post-operatively.  Please see the procedural report for the amount of contrast and the fluoroscopy time utilized.  Comparison:  None  Findings:  No persistent filling defects in the common duct. Intrahepatic ducts are incompletely visualized, appearing decompressed centrally. Contrast passes into the duodenum.  IMPRESSION  Negative for retained common duct stone.  Original Report Authenticated By: Osa Craver, M.D.    Anti-infectives: Anti-infectives     Start     Dose/Rate Route Frequency Ordered Stop   07/03/11 2230   ceFAZolin (ANCEF) IVPB 1 g/50 mL premix        1  g 100 mL/hr over 30 Minutes Intravenous Every 8 hours 07/03/11 2229 07/04/11 0652   07/03/11 0837   ceFAZolin (ANCEF) IVPB 2 g/50 mL premix        2 g 100 mL/hr over 30 Minutes Intravenous 30 min pre-op 07/03/11 0837 07/03/11 1715          Assessment/Plan: s/p Procedure(s): ROBOTIC ASSITED PARTIAL NEPHRECTOMY LAPAROSCOPIC CHOLECYSTECTOMY WITH INTRAOPERATIVE CHOLANGIOGRAM OPERATIVE ULTRASOUND Doing well post op without apparent complication   LOS: 1 day    Ernst Cumpston T 07/04/2011

## 2011-07-04 NOTE — Op Note (Signed)
NAMEALANNIS, HSIA NO.:  [id number]  MEDICAL RECORD NO.:  [id number]  LOCATION:  1423                         FACILITY:  [facility]  PHYSICIAN:  Natalia Leatherwood, MD    DATE OF BIRTH:  [birth year]  DATE OF PROCEDURE:  07/03/2011 DATE OF DISCHARGE:                              OPERATIVE REPORT   SURGEON:  Natalia Leatherwood, MD.  ASSISTANTS: 1. Heloise Purpura, MD. 2. Pecola Leisure, [place].  PREOPERATIVE DIAGNOSIS:  Left renal mass.  POSTOPERATIVE DIAGNOSIS:  Left renal mass.  PROCEDURES PERFORMED: 1. Robotic-assisted laparoscopic left partial nephrectomy. 2. Laparoscopic lysis of adhesions. 3. Intraoperative ultrasound.  FINDINGS:  Multiple abdominal adhesions of the intestine as well as adhesions of the intestine to the spleen into the overlying Gerota fascia of the kidney.  COMPLICATIONS:  None.  ESTIMATED BLOOD LOSS:  20 cc.  SPECIMEN:  Left renal mass and left margin of renal mass.  DRAINS:  Jackson-Pratt drain left lower quadrant and Foley catheter.  HISTORY OF PRESENT ILLNESS:  A 46 year old female who presented to the ER with abdominal pain.  She was found to have cholelithiasis as well as a small left renal mass.  She was given options regarding her renal mass and also had wished to proceed with a laparoscopic cholecystectomy, and she elected to do both at the same setting.  DESCRIPTION OF PROCEDURE:  After informed was obtained, the patient was taken to the operating room.  She was first taken by Dr. Avel Peace, who performed a laparoscopic cholecystectomy.  After he completed this portion of his procedure, he left a pursestring sutures in the fascia of his one of the 12 mm ports in the midline above the umbilicus and placed the pursestrings in the fascia and the skin was stapled and covered with a Tegaderm.  I was then called for my portion of the procedure.  The patient was already under general anesthetic and she had already had IV  antibiotics.  Placed her in a relaxed lateral position using a beanbag, axillary rolls and gel padding to support all pertinent neurovascular pressure points.  A Foley catheter was already in place in the patient's bladder.  The position was with the left side up.  Her arm was positioned with an armboard to make sure that all pertinent neurovascular pressure points were padded appropriately. Following this, she was secured to the table.  After the table had a slight flexion placed in it and a beanbag was desufflated, she was secured to the table with tape putting blue towels between the tape in her skin.  Following this, her abdomen was prepped and draped in usual sterile fashion.  Time-out was performed in which the correct patient, surgical site, and procedure were all identified and agreed upon by the team.  Next, the staples were removed from the supraumbilical midline port, and a 12 mm trocar was placed through this and the abdomen was insufflated with carbon dioxide.  Once this was done, the laparoscopic ports were placed under direct visualization.  The first port that was placed was a 12 mm port and placed lateral to the rectus muscle, just slightly superior to the umbilicus.  The 8 mm robot arms  were placed in a triangulated fashion, below the left costal margin and in the left upper quadrant.  The other robotic port was placed just superior to the anterior superior iliac spine.  After this was done, assistant ports were placed in the midline.  There were two 12 mm assistant ports. These were placed superior to the 12 mm port placed by Dr. Abbey Chatters, and then inferior to the umbilicus.  After this was done, the robot was docked and the procedure was begun.  There were extensive adhesions of the bowel and colon to the sidewall of the abdomen.  I spent approximately the 1st hour and a half, performing lysis of adhesions. After this was done, the colon was reflected medially from  the left side of the abdomen.  It was noted that the colon was stuck to the Gerota fascia, making dissection more difficult than normally encountered. After this was done, Gerota fascia was entered and divided to the surface of the kidney to identify the lower pole of the kidney, and then, dissected more freely inferiorly.  Following this, I entered the retroperitoneal fat and was able to dissect out and identify the ureter and the gonadal vein.  Due to multiple smaller vessels coming off the gonadal vein, did ligate it with titanium surgical clips and it was divided.  This was so that I could place lateral traction underneath the gonadal artery to dissect up underneath the kidney and dissect up the left renal vein.  The 4th arm of the robot was used to place traction up underneath between the posterior wall of the abdomen and the kidney.  The gonadal was dissected up to the renal vein and the renal vein was dissected circumferentially, and then, the artery was identified posterior slightly superior to this.  It was dissected completely around as well.  After this was done, the attention was turned to the kidney.  The fat was removed from the anterior surface and lower pole of the kidney.  Intraoperative ultrasound was placed and was very difficult to locate the location of the tumor due to its endophytic nature.  We spent approximately 30 minutes performing this portion of the procedure, and after this mass was identified, the V-Loc sutures were placed into the abdomen.  The patient was given 12.5 g of mannitol, and then, a bulldog clamp was placed on the main renal artery. The vein was left free.  Next, the mass was resected out using sharp scissors and redirecting as the mass was encountered.  There was an area on the lateral portion of the mass that appeared to have a tumor left behind, and therefore, I cut this section out, sent to pathology as a separate segment as a margin of the  tumor.  Once the tumor was then removed, it was placed in the EndoCatch bag, and the edges of the renal parenchyma were fulgurated with monopolar scissors.  I did not see any entrance into the collecting system.  There was a vein that had to be divided during resection.  After this, a 3-0 V-Loc suture was placed through the capsule with a Hem-o-lock clamp at the end and running suture was sewn on the inside of the kidney to close up the inside portion of the kidney as well as the vein that had been divided.  This was brought back out the parenchyma on the other side, and another Hem-o-lok clip was placed across this and tightened down.  Next, two Hem-o-lok sutures, which were 2-0 sutures  that were two tailed were placed in the kidney in a horizontal mattress fashion.  Before these were tightened down, FloSeal was placed into the defect, and then, these were tightened down and Hem-o-lok clips were placed up on these outside the capsule.  After this was done, attention was turned back to the bulldog clamp which was removed and the site resection was visualized.  There was no bleeding for several minutes.  The total clamp time was 38 minutes.  After the clamp was removed, another 12.5 g of mannitol was given to the patient. Gerota fascia was then reapproximated over the patient's kidney with Hem- o-lok clips, and a Jackson-Pratt fluted drain was placed at Gerota fascia.  This was sewn into place on the skin with a nylon suture.  This was placed through the 3rd robotic arm port.  Following this, all 12 mm ports were closed using a laparoscopic suture passer with 0 Vicryl stitches.  This was done under direct visualization. There was no bleeding from any of the port sites. After this was done, the wounds were injected with Exparel 40 mL.  The wounds were then washed out with sterile normal saline and then closed with staples.  These were then covered with a Tegaderm.  She was placed back in  supine position. Anesthesia was reversed.  She was taken to PACU in stable condition. She will be admitted for recovery.          ______________________________ Natalia Leatherwood, MD     DW/MEDQ  D:  07/03/2011  T:  07/04/2011  Job:  [id number]

## 2011-07-05 LAB — GLUCOSE, CAPILLARY
Glucose-Capillary: 145 mg/dL — ABNORMAL HIGH (ref 70–99)
Glucose-Capillary: 154 mg/dL — ABNORMAL HIGH (ref 70–99)
Glucose-Capillary: 155 mg/dL — ABNORMAL HIGH (ref 70–99)
Glucose-Capillary: 166 mg/dL — ABNORMAL HIGH (ref 70–99)
Glucose-Capillary: 166 mg/dL — ABNORMAL HIGH (ref 70–99)
Glucose-Capillary: 187 mg/dL — ABNORMAL HIGH (ref 70–99)

## 2011-07-05 LAB — CREATININE, FLUID (PLEURAL, PERITONEAL, JP DRAINAGE): Creat, Fluid: 0.7 mg/dL

## 2011-07-05 MED ORDER — HEPARIN SODIUM (PORCINE) 5000 UNIT/ML IJ SOLN
5000.0000 [IU] | Freq: Three times a day (TID) | INTRAMUSCULAR | Status: DC
  Administered 2011-07-05 – 2011-07-07 (×7): 5000 [IU] via SUBCUTANEOUS
  Filled 2011-07-05 (×10): qty 1

## 2011-07-05 NOTE — Progress Notes (Signed)
General Surgery Note  LOS: 2 days  POD# 2  Assessment/Plan: 1. ROBOTIC ASSITED PARTIAL Left NEPHRECTOMY, LAPAROSCOPIC CHOLECYSTECTOMY WITH INTRAOPERATIVE CHOLANGIOGRAM, OPERATIVE ULTRASOUND - 07/03/2011 - Alicia Horne and Rosenbower.  Doing well.  Taking full liquids.  Final path pending.  Her biggest complaint has been a sore mouth.  Mother and brother in room with patient.  Subjective:  Doing well.  She says she is much better today.  Out of bed in chair. Objective:   Filed Vitals:   07/05/11 1000  BP: 116/75  Pulse: 107  Temp: 99 F (37.2 C)  Resp: 18    Intake/Output from previous day:  05/11 0701 - 05/12 0700 In: 4085.4 [P.O.:1200; I.V.:2885.4] Out: 2575 [Urine:2400; Drains:175]  Intake/Output this shift:  Total I/O In: 120 [P.O.:120] Out: 31 [Urine:1; Drains:30]   Physical Exam:   General: Obese WF who is alert and oriented.    HEENT: Normal. Pupils equal. .   Lungs: Clear.   Abdomen: Soft.  Few BS.   Wound: Dressing in place.   Neurologic:  Grossly intact to motor and sensory function.   Psychiatric: Has normal mood and affect. Behavior is normal   Lab Results:    Basename 07/05/11 0932 07/04/11 0504  WBC -- 11.2*  HGB 12.0 11.3*  HCT 36.0 34.5*  PLT -- 273    BMET   Basename 07/04/11 0504 07/03/11 2004  NA 137 137  K 3.6 4.6  CL 101 102  CO2 25 21  GLUCOSE 188* 265*  BUN 8 10  CREATININE 0.74 0.77  CALCIUM 8.6 8.3*    PT/INR  No results found for this basename: LABPROT:2,INR:2 in the last 72 hours  ABG  No results found for this basename: PHART:2,PCO2:2,PO2:2,HCO3:2 in the last 72 hours   Studies/Results:  Dg Cholangiogram Operative  07/03/2011  *RADIOLOGY REPORT*  Clinical Data:   Cholelithiasis  INTRAOPERATIVE CHOLANGIOGRAM  Technique:  Cholangiographic images from the C-arm fluoroscopic device were submitted for interpretation post-operatively.  Please see the procedural report for the amount of contrast and the fluoroscopy time utilized.   Comparison:  None  Findings:  No persistent filling defects in the common duct. Intrahepatic ducts are incompletely visualized, appearing decompressed centrally. Contrast passes into the duodenum.  IMPRESSION  Negative for retained common duct stone.  Original Report Authenticated By: Osa Craver, M.D.     Anti-infectives:   Anti-infectives     Start     Dose/Rate Route Frequency Ordered Stop   07/03/11 2230   ceFAZolin (ANCEF) IVPB 1 g/50 mL premix        1 g 100 mL/hr over 30 Minutes Intravenous Every 8 hours 07/03/11 2229 07/04/11 0652   07/03/11 0837   ceFAZolin (ANCEF) IVPB 2 g/50 mL premix        2 g 100 mL/hr over 30 Minutes Intravenous 30 min pre-op 07/03/11 0837 07/03/11 1715          Ovidio Kin, MD, FACS Pager: [phone],   [facility] Office: [phone] 07/05/2011

## 2011-07-05 NOTE — Progress Notes (Signed)
2 Days Post-Op Subjective: Patient reports no significant abdominal pain. She is still having some swelling and tingling of her lower lip. Scalp discomfort is less. She has not had flatus yet.  Objective: Vital signs in last 24 hours: Temp:  [97.7 F (36.5 C)-98.5 F (36.9 C)] 97.7 F (36.5 C) (05/12 0440) Pulse Rate:  [64-87] 87  (05/12 0440) Resp:  [16-18] 18  (05/12 0440) BP: (124-148)/(69-79) 148/75 mmHg (05/12 0440) SpO2:  [92 %-96 %] 92 % (05/12 0440) Weight:  [121.11 kg (267 lb)] 121.11 kg (267 lb) (05/11 1900)  Intake/Output from previous day: 05/11 0701 - 05/12 0700 In: 4085.4 [P.O.:1200; I.V.:2885.4] Out: 2575 [Urine:2400; Drains:175] Intake/Output this shift: Total I/O In: 3365.4 [P.O.:480; I.V.:2885.4] Out: 935 [Urine:850; Drains:85]  Physical Exam:  Constitutional: Vital signs reviewed. WD WN in NAD   Eyes: PERRL, No scleral icterus.   Cardiovascular: RRR Pulmonary/Chest: Normal effort Abdominal: Still slightly distended, although not tender. Incisions are all healing well without significant drainage. Bowel sounds are minimal.  J-P drainage has increased. This is most likely intraperitoneal fluid. Lab Results:  Basename 07/04/11 0504 07/03/11 2004  HGB 11.3* 12.0  HCT 34.5* 36.0   BMET  Basename 07/04/11 0504 07/03/11 2004  NA 137 137  K 3.6 4.6  CL 101 102  CO2 25 21  GLUCOSE 188* 265*  BUN 8 10  CREATININE 0.74 0.77  CALCIUM 8.6 8.3*   No results found for this basename: LABPT:3,INR:3 in the last 72 hours No results found for this basename: LABURIN:1 in the last 72 hours Results for orders placed during the hospital encounter of 06/25/11  SURGICAL PCR SCREEN     Status: Normal   Collection Time   06/25/11 10:17 AM      Component Value Range Status Comment   MRSA, PCR NEGATIVE  NEGATIVE  Final    Staphylococcus aureus NEGATIVE  NEGATIVE  Final     Studies/Results: Dg Cholangiogram Operative  07/03/2011  *RADIOLOGY REPORT*  Clinical Data:    Cholelithiasis  INTRAOPERATIVE CHOLANGIOGRAM  Technique:  Cholangiographic images from the C-arm fluoroscopic device were submitted for interpretation post-operatively.  Please see the procedural report for the amount of contrast and the fluoroscopy time utilized.  Comparison:  None  Findings:  No persistent filling defects in the common duct. Intrahepatic ducts are incompletely visualized, appearing decompressed centrally. Contrast passes into the duodenum.  IMPRESSION  Negative for retained common duct stone.  Original Report Authenticated By: Osa Craver, M.D.    Assessment/Plan:   Postoperative day 2 laparoscopic cholecystectomy and robotic-assisted laparoscopic partial nephrectomy on the left. She seems to be progressing well. J-P fluid has increased.    I will advance her diet as tolerated, and get her out of bed. J-P fluid will be sent for creatinine. Will add subcutaneous heparin    LOS: 2 days   Marcine Matar M 07/05/2011, 6:57 AM

## 2011-07-06 LAB — BASIC METABOLIC PANEL
BUN: 7 mg/dL (ref 6–23)
Calcium: 8.5 mg/dL (ref 8.4–10.5)
Creatinine, Ser: 0.59 mg/dL (ref 0.50–1.10)
GFR calc Af Amer: >90 mL/min (ref >90)
GFR calc non Af Amer: >90 mL/min (ref >90)
Potassium: 3.3 mEq/L — ABNORMAL LOW (ref 3.5–5.1)

## 2011-07-06 LAB — GLUCOSE, CAPILLARY
Glucose-Capillary: 137 mg/dL — ABNORMAL HIGH (ref 70–99)
Glucose-Capillary: 127 mg/dL — ABNORMAL HIGH (ref 70–99)
Glucose-Capillary: 163 mg/dL — ABNORMAL HIGH (ref 70–99)

## 2011-07-06 MED ORDER — BISACODYL 10 MG RE SUPP: 10.0000 mg | Freq: Two times a day (BID) | RECTAL | Status: AC

## 2011-07-06 MED ORDER — PROMETHAZINE HCL 25 MG/ML IJ SOLN
12.5000 mg | Freq: Four times a day (QID) | INTRAMUSCULAR | Status: DC | PRN
  Administered 2011-07-06 (×2): 12.5 mg via INTRAVENOUS
  Filled 2011-07-06 (×2): qty 1

## 2011-07-06 MED ORDER — VALACYCLOVIR HCL 500 MG PO TABS
1000.0000 mg | ORAL_TABLET | Freq: Once | ORAL | Status: AC
  Administered 2011-07-06: 1000 mg via ORAL
  Filled 2011-07-06: qty 2

## 2011-07-06 MED ORDER — OXYCODONE HCL 5 MG PO TABS
5.0000 mg | ORAL_TABLET | ORAL | Status: DC | PRN
  Administered 2011-07-06: 5 mg via ORAL
  Administered 2011-07-07: 10 mg via ORAL
  Filled 2011-07-06: qty 1
  Filled 2011-07-06: qty 2

## 2011-07-06 MED ORDER — BISACODYL 10 MG RE SUPP: 10.0000 mg | Freq: Two times a day (BID) | RECTAL | Status: DC

## 2011-07-06 MED ORDER — SENNOSIDES-DOCUSATE SODIUM 8.6-50 MG PO TABS: 2.0000 | ORAL_TABLET | Freq: Every day | ORAL | Status: AC

## 2011-07-06 MED ORDER — OXYCODONE HCL 5 MG PO TABS: 5.0000 mg | ORAL_TABLET | ORAL | Status: AC | PRN

## 2011-07-06 NOTE — Progress Notes (Signed)
   CARE MANAGEMENT NOTE 07/06/2011  Patient:  Alicia Horne, Alicia Horne   Account Number:  [id number]  Date Initiated:  07/06/2011  Documentation initiated by:  Jiles Crocker  Subjective/Objective Assessment:   ADMITTED FOR :  ROBOTIC ASSITED PARTIAL NEPHRECTOMY (Left)     Action/Plan:   INDEPENDENT PRIOR TO ADMISSION   Anticipated DC Date:  07/10/2011   Anticipated DC Plan:  HOME/SELF CARE           Status of service:  In process, will continue to follow Medicare Important Message given?  NA - LOS <3 / Initial given by admissions (If response is "NO", the following Medicare IM given date fields will be blank)  Per UR Regulation:  Reviewed for med. necessity/level of care/duration of stay  Comments:  07/06/2011- B Akima Slaugh RN, BSN, MHA

## 2011-07-06 NOTE — Progress Notes (Signed)
Today I gave the patient results of her pathology. This showed renal cell carcinoma with negative margins.

## 2011-07-06 NOTE — Progress Notes (Signed)
3 Days Post-Op  Subjective: Passing some gas.  Had 2 BMs yesterday.  Nausea present but improving.  Sore around incisions.  Objective: Vital signs in last 24 hours: Temp:  [98.1 F (36.7 C)-99 F (37.2 C)] 98.6 F (37 C) (05/13 0432) Pulse Rate:  [83-107] 86  (05/13 0432) Resp:  [16-18] 18  (05/13 0432) BP: (114-156)/(67-80) 156/80 mmHg (05/13 0432) SpO2:  [92 %-96 %] 94 % (05/13 0432) Last BM Date: 07/05/11  Intake/Output from previous day: 05/12 0701 - 05/13 0700 In: 2053.3 [P.O.:600; I.V.:1453.3] Out: 1166 [Urine:1001; Drains:165] Intake/Output this shift:    PE: Abd-soft, slight rash around stapled incisions, active bowel sounds.  Lab Results:   Basename 07/05/11 0932 07/04/11 0504  WBC -- 11.2*  HGB 12.0 11.3*  HCT 36.0 34.5*  PLT -- 273   BMET  Basename 07/04/11 0504 07/03/11 2004  NA 137 137  K 3.6 4.6  CL 101 102  CO2 25 21  GLUCOSE 188* 265*  BUN 8 10  CREATININE 0.74 0.77  CALCIUM 8.6 8.3*   PT/INR No results found for this basename: LABPROT:2,INR:2 in the last 72 hours Comprehensive Metabolic Panel:    Component Value Date/Time   NA 137 07/04/2011 0504   K 3.6 07/04/2011 0504   CL 101 07/04/2011 0504   CO2 25 07/04/2011 0504   BUN 8 07/04/2011 0504   CREATININE 0.74 07/04/2011 0504   GLUCOSE 188* 07/04/2011 0504   CALCIUM 8.6 07/04/2011 0504   AST 35 06/25/2011 1110   ALT 42* 06/25/2011 1110   ALKPHOS 74 06/25/2011 1110   BILITOT 0.3 06/25/2011 1110   PROT 7.1 06/25/2011 1110   ALBUMIN 3.4* 06/25/2011 1110     Studies/Results: No results found.  Anti-infectives: Anti-infectives     Start     Dose/Rate Route Frequency Ordered Stop   07/03/11 2230   ceFAZolin (ANCEF) IVPB 1 g/50 mL premix        1 g 100 mL/hr over 30 Minutes Intravenous Every 8 hours 07/03/11 2229 07/04/11 0652   07/03/11 0837   ceFAZolin (ANCEF) IVPB 2 g/50 mL premix        2 g 100 mL/hr over 30 Minutes Intravenous 30 min pre-op 07/03/11 5956 07/03/11 1715           Assessment Principal Problem:  *Left kidney mass s/p lap assisted partial nephrectomy and lap chole on 07/03/11. Active Problems:  Symptomatic cholelithiasis    Slowly improving.    LOS: 3 days   Plan: Advance diet when nausea improves.   Alicia Horne J 07/06/2011

## 2011-07-06 NOTE — Progress Notes (Signed)
Urology Progress Note  Subjective:     No acute urologic events overnight. Positive ambulation. Positive flatus, but not regularly. Positive liquid BM overnight. Mild nausea. Abdominal pain controlled. Ulcers on mouth & tongue improving. Left ear swelling improved along with right occiput swelling.  ROS: Negative: SOB  Objective:  Patient Vitals for the past 24 hrs:  BP Temp Temp src Pulse Resp SpO2  07/06/11 0432 156/80 mmHg 98.6 F (37 C) Oral 86  18  94 %  07/05/11 2116 128/67 mmHg 98.6 F (37 C) Oral 93  18  96 %  07/05/11 1400 114/78 mmHg 98.1 F (36.7 C) Oral 83  16  92 %  07/05/11 1000 116/75 mmHg 99 F (37.2 C) Oral 107  18  92 %    Physical Exam: General:  No acute distress, awake Cardiovascular:    [x]   S1/S2 present, RRR  []   Irregularly irregular Chest:  CTA-B Abdomen:               []  Soft, appropriately TTP  []  Soft, NTTP  [x]  Soft, appropriately TTP, incision(s) clean/dry/intact, JP serosanguinous.  Genitourinary: No foley catheter.    I/O last 3 completed shifts: In: 5418.8 [P.O.:1080; I.V.:4338.8] Out: 2101 [Urine:1851; Drains:250]  Urine: 1001 mL JP ( )  Recent Labs  [facility] 07/05/11 0932 07/04/11 0504   HGB 12.0 11.3*   WBC -- 11.2*   PLT -- 273    Recent Labs  [facility] 07/04/11 0504 07/03/11 2004   NA 137 137   K 3.6 4.6   CL 101 102   CO2 25 21   BUN 8 10   CREATININE 0.74 0.77   CALCIUM 8.6 8.3*   GFRNONAA >90 >90   GFRAA >90 >90     No results found for this basename: PT:2,INR:2,APTT:2 in the last 72 hours   No components found with this basename: ABG:2    Length of stay: 3 days.  Assessment: Left renal mass. POD#3 Left robot/laparoscopic partial nephrectomy. Lap cholecystectomy.  Plan: -Change dulcolax suppository to BID. -Continue ambulation. -Keep on full liquids until she has less nausea. -JP fluid creatinine consistent with serum; no evidence of urine leak. Continue JP for now.   Natalia Leatherwood,  MD [phone]

## 2011-07-07 LAB — GLUCOSE, CAPILLARY
Glucose-Capillary: 112 mg/dL — ABNORMAL HIGH (ref 70–99)
Glucose-Capillary: 123 mg/dL — ABNORMAL HIGH (ref 70–99)
Glucose-Capillary: 139 mg/dL — ABNORMAL HIGH (ref 70–99)

## 2011-07-07 MED ORDER — VALACYCLOVIR HCL 500 MG PO TABS
1000.0000 mg | ORAL_TABLET | Freq: Once | ORAL | Status: AC
  Administered 2011-07-07: 1000 mg via ORAL
  Filled 2011-07-07: qty 2

## 2011-07-07 MED ORDER — FLUCONAZOLE 200 MG PO TABS
200.0000 mg | ORAL_TABLET | Freq: Once | ORAL | Status: AC
Start: 2011-07-07 — End: 2011-07-07
  Administered 2011-07-07: 200 mg via ORAL
  Filled 2011-07-07: qty 1

## 2011-07-07 NOTE — Progress Notes (Signed)
4 Days Post-Op  Subjective: Bowels have moved.  Tolerating lowfat diet.  Objective: Vital signs in last 24 hours: Temp:  [98.3 F (36.8 C)-99.6 F (37.6 C)] 98.3 F (36.8 C) (05/14 0458) Pulse Rate:  [78-82] 79  (05/14 0458) Resp:  [16-18] 16  (05/14 0458) BP: (118-164)/(77-81) 164/77 mmHg (05/14 0458) SpO2:  [96 %-98 %] 96 % (05/14 0458) Last BM Date: 07/06/11  Intake/Output from previous day: 05/13 0701 - 05/14 0700 In: 360 [P.O.:360] Out: 1840 [Urine:1750; Drains:90] Intake/Output this shift:    PE: Abd-soft, incisions clean and dry  Lab Results:   Basename 07/05/11 0932  WBC --  HGB 12.0  HCT 36.0  PLT --   BMET  Basename 07/06/11 0823  NA 134*  K 3.3*  CL 99  CO2 22  GLUCOSE 167*  BUN 7  CREATININE 0.59  CALCIUM 8.5   PT/INR No results found for this basename: LABPROT:2,INR:2 in the last 72 hours Comprehensive Metabolic Panel:    Component Value Date/Time   NA 134* 07/06/2011 0823   K 3.3* 07/06/2011 0823   CL 99 07/06/2011 0823   CO2 22 07/06/2011 0823   BUN 7 07/06/2011 0823   CREATININE 0.59 07/06/2011 0823   GLUCOSE 167* 07/06/2011 0823   CALCIUM 8.5 07/06/2011 0823   AST 35 06/25/2011 1110   ALT 42* 06/25/2011 1110   ALKPHOS 74 06/25/2011 1110   BILITOT 0.3 06/25/2011 1110   PROT 7.1 06/25/2011 1110   ALBUMIN 3.4* 06/25/2011 1110     Studies/Results: No results found.  Anti-infectives: Anti-infectives     Start     Dose/Rate Route Frequency Ordered Stop   07/07/11 0800   valACYclovir (VALTREX) tablet 1,000 mg        1,000 mg Oral  Once 07/07/11 0733     07/07/11 0800   fluconazole (DIFLUCAN) tablet 200 mg        200 mg Oral  Once 07/07/11 0733     07/06/11 1800   valACYclovir (VALTREX) tablet 1,000 mg        1,000 mg Oral  Once 07/06/11 1726 07/06/11 1830   07/03/11 2230   ceFAZolin (ANCEF) IVPB 1 g/50 mL premix        1 g 100 mL/hr over 30 Minutes Intravenous Every 8 hours 07/03/11 2229 07/04/11 0652   07/03/11 0837   ceFAZolin (ANCEF)  IVPB 2 g/50 mL premix        2 g 100 mL/hr over 30 Minutes Intravenous 30 min pre-op 07/03/11 1610 07/03/11 1715          Assessment Principal Problem:  *Left kidney mass s/p lap assisted partial nephrectomy and lap chole on 07/03/11. Active Problems:  Symptomatic cholelithiasis  Fatty infiltration of the liver-I explained the potential consequences of this (cirrhosis) and instructed her on a life long lowfat diet.  Doing well   LOS: 4 days   Plan: Discharge today.  RTC 2-3 weeks.   Malachai Schalk J 07/07/2011

## 2011-07-07 NOTE — Discharge Summary (Signed)
Physician Discharge Summary  Patient ID: Alicia Horne MRN: [id number] DOB/AGE: 03/11/1965 46 y.o.  Admit date: 07/03/2011 Discharge date: 07/07/2011  Admission Diagnoses: Left renal mass  Discharge Diagnoses:  Principal Problem:  *Left kidney mass s/p lap assisted partial nephrectomy and lap chole on 07/03/11. Active Problems:  Symptomatic cholelithiasis   Discharged Condition: good  Hospital Course:  The patient was admitted to the hospital following robot assisted laparoscopic left partial nephrectomy. She also had a laparoscopic cholecystectomy. She initially had a small ileus and some nausea. This resolved with ambulation and a bowel regimen. Her pain was able to be controlled with oral pain medication. She was able to ambulate early in her hemoglobin remained stable after 24 hours of bed rest. She was started on chemoprophylaxis for DVT after this 24-hour period. Her main complaint was a lesion on her tongue, lips, Left ear, and right occiput which were likely due to the endotracheal tube and positioning during her surgery. Her JP drain had high volume output, but fluid checked for creatinine was consistent with serum. Her catheter was removed and she was able to void without difficulty. She was given Valtrex for her oral wounds and these turned into cold sores and she was also given a dose of fluconazole for a vaginal yeast infection. She was able to be discharged home on the fourth operative day with return of bowel function without nausea and the ability to tolerate solid food.  Consults: None  Significant Diagnostic Studies: None  Treatments: IV hydration and surgery: Laparoscopic cholecystectomy; robotic laparoscopic left partial nephrectomy.  Discharge Exam: Blood pressure 164/77, pulse 79, temperature 98.3 F (36.8 C), temperature source Oral, resp. rate 16, height 5\' 3"  (1.6 m), weight 121.11 kg (267 lb), SpO2 96.00%. See PE from progress note on date of  discharge.  Disposition: Home to self care.   Discharge Orders    Future Orders Please Complete By Expires   Discharge patient        Medication List  As of 07/07/2011  7:34 AM   TAKE these medications         bisacodyl 10 MG suppository   Commonly known as: DULCOLAX   Place 1 suppository (10 mg total) rectally 2 (two) times daily.      glimepiride 4 MG tablet   Commonly known as: AMARYL      levothyroxine 125 MCG tablet   Commonly known as: SYNTHROID, LEVOTHROID      lidocaine 5 %   Commonly known as: LIDODERM   Place 1 patch onto the skin daily. Remove & Discard patch within 12 hours or as directed by MD      lisinopril-hydrochlorothiazide 20-12.5 MG per tablet   Commonly known as: PRINZIDE,ZESTORETIC   Take 1 tablet by mouth daily with breakfast.      metFORMIN 500 MG 24 hr tablet   Commonly known as: GLUCOPHAGE-XR      multivitamin with minerals tablet   Take 1 tablet by mouth daily.      oxyCODONE 5 MG immediate release tablet   Commonly known as: Oxy IR/ROXICODONE   Take 1-2 tablets (5-10 mg total) by mouth every 4 (four) hours as needed.      pravastatin 40 MG tablet   Commonly known as: PRAVACHOL   Take 40 mg by mouth daily.      senna-docusate 8.6-50 MG per tablet   Commonly known as: Senokot-S   Take 2 tablets by mouth at bedtime.  Follow-up Information    Follow up with Saunders Revel, PA on 07/13/2011. (11:30 am)    Contact information:   [address] [facility] [address] [facility] [place] [address] [phone]          Signed: Milford Cage 07/07/2011, 7:34 AM

## 2011-07-07 NOTE — Progress Notes (Signed)
Urology Progress Note  Subjective:     No acute urologic events overnight. Positive ambulation. Positive regular flatus; positive BM. No nausea. Tolerating regular low fat diet. Complains of vaginal yeast infection.  ROS: Negative: SOB  Objective:  Patient Vitals for the past 24 hrs:  BP Temp Temp src Pulse Resp SpO2  07/07/11 0458 164/77 mmHg 98.3 F (36.8 C) Oral 79  16  96 %  07/06/11 2106 118/78 mmHg 98.8 F (37.1 C) Oral 78  16  98 %  07/06/11 1508 138/81 mmHg 99.6 F (37.6 C) Oral 82  18  97 %    Physical Exam: General:  No acute distress, awake Cardiovascular:    [x]   S1/S2 present, RRR  []   Irregularly irregular Chest:  CTA-B Abdomen:               []  Soft, appropriately TTP  []  Soft, NTTP  [x]  Soft, appropriately TTP, incision(s) clean/dry/intact, JP has been removed.  Genitourinary: No foley catheter.    I/O last 3 completed shifts: In: 2053.3 [P.O.:600; I.V.:1453.3] Out: 2515 [Urine:2350; Drains:165]    Recent Labs  [facility] 07/05/11 0932   HGB 12.0   WBC --   PLT --    Recent Labs  [facility] 07/06/11 0823   NA 134*   K 3.3*   CL 99   CO2 22   BUN 7   CREATININE 0.59   CALCIUM 8.5   GFRNONAA >90   GFRAA >90     No results found for this basename: PT:2,INR:2,APTT:2 in the last 72 hours   No components found with this basename: ABG:2    Length of stay: 4 days.  Assessment: Left renal mass. POD#4 Left robot/laparoscopic partial nephrectomy. Lap cholecystectomy.  Plan: -Fluconazole for vaginal yeast infection. -One more dose of valtrex. -Discharge home  Natalia Leatherwood, MD [phone]

## 2011-07-08 ENCOUNTER — Encounter (HOSPITAL_COMMUNITY): Payer: Self-pay | Admitting: Urology

## 2011-07-30 ENCOUNTER — Ambulatory Visit (INDEPENDENT_AMBULATORY_CARE_PROVIDER_SITE_OTHER): Payer: BC Managed Care – PPO | Admitting: General Surgery

## 2011-07-30 ENCOUNTER — Encounter (INDEPENDENT_AMBULATORY_CARE_PROVIDER_SITE_OTHER): Payer: Self-pay | Admitting: General Surgery

## 2011-07-30 VITALS — BP 142/82 | HR 84 | Temp 98.9°F | Resp 18 | Ht 63.0 in | Wt 252.6 lb

## 2011-07-30 DIAGNOSIS — Z9889 Other specified postprocedural states: Secondary | ICD-10-CM

## 2011-07-30 MED ORDER — ONDANSETRON HCL 4 MG PO TABS: 4.0000 mg | ORAL_TABLET | Freq: Three times a day (TID) | ORAL | Status: AC | PRN

## 2011-07-30 NOTE — Patient Instructions (Addendum)
Clean abdominal wounds in shower and apply a dry dressing twice a day.  Call us if the wounds are not better in 4 weeks.

## 2011-07-30 NOTE — Progress Notes (Signed)
Operation:  Laparoscopic cholecystectomy and robotic assisted left nephrectomy   Date: Jul 03, 2011   HPI: She is here for her first postoperative visit. She is doing fairly well until earlier this week when she developed nausea. She's also had some problems with some of her wound opening up and draining. Her primary care physician put her on some Cipro. She is feeling a little better with respect to the nausea today.   Physical Exam: Abdomen-she has 3 open wounds that are very superficial in the periumbilical region. There is no purulent drainage. The rest of the small incisions are clean and intact  Assessment: Superficial wound separation in 3 areas and possible superficial infection. She's been started on Cipro. Since that time, her nausea is improved.  Plan: Clean the wound twice daily in the shower and follow this by coverage with a dry dressing. I gave her a prescription for some Zofran. If the wounds have not healed in 4 weeks I have asked her to call us back and we could see her again.

## 2011-11-27 ENCOUNTER — Ambulatory Visit (HOSPITAL_COMMUNITY)
Admission: RE | Admit: 2011-11-27 | Discharge: 2011-11-27 | Disposition: A | Discharge status: Home / Self Care | Payer: BC Managed Care – PPO | Source: Ambulatory Visit | Attending: Urology | Admitting: Urology

## 2011-11-27 ENCOUNTER — Other Ambulatory Visit (HOSPITAL_COMMUNITY): Payer: Self-pay | Admitting: Urology

## 2011-11-27 DIAGNOSIS — C649 Malignant neoplasm of unspecified kidney, except renal pelvis: Secondary | ICD-10-CM

## 2011-11-27 DIAGNOSIS — E119 Type 2 diabetes mellitus without complications: Secondary | ICD-10-CM | POA: Insufficient documentation

## 2011-11-27 DIAGNOSIS — I10 Essential (primary) hypertension: Secondary | ICD-10-CM | POA: Insufficient documentation

## 2011-11-27 DIAGNOSIS — Z905 Acquired absence of kidney: Secondary | ICD-10-CM | POA: Insufficient documentation

## 2012-06-22 ENCOUNTER — Other Ambulatory Visit: Payer: Self-pay | Admitting: Nurse Practitioner

## 2012-06-22 ENCOUNTER — Telehealth: Payer: Self-pay | Admitting: Family Medicine

## 2012-06-22 NOTE — Telephone Encounter (Signed)
Ok with 2 ref

## 2012-06-22 NOTE — Telephone Encounter (Signed)
Nurse: Ilda BassetQuincy Horne  Refill Request: Insulin pen (victoza) * per the patient she is needing the refill as soon as possible.

## 2012-06-24 ENCOUNTER — Encounter: Payer: Self-pay | Admitting: *Deleted

## 2012-09-08 ENCOUNTER — Other Ambulatory Visit: Payer: Self-pay | Admitting: Family Medicine

## 2012-11-19 ENCOUNTER — Other Ambulatory Visit: Payer: Self-pay | Admitting: Nurse Practitioner

## 2012-11-28 ENCOUNTER — Other Ambulatory Visit: Payer: Self-pay | Admitting: Family Medicine

## 2012-12-02 ENCOUNTER — Telehealth: Payer: Self-pay | Admitting: Family Medicine

## 2012-12-02 ENCOUNTER — Other Ambulatory Visit: Payer: Self-pay

## 2012-12-02 MED ORDER — LEVOTHYROXINE SODIUM 125 MCG PO TABS: 125.0000 ug | ORAL_TABLET | Freq: Every day | ORAL | Status: DC

## 2012-12-02 MED ORDER — METFORMIN HCL ER 500 MG PO TB24: ORAL_TABLET | ORAL | Status: DC

## 2012-12-02 NOTE — Telephone Encounter (Signed)
Patient needs refill of levothyroxine to [facility] in [place]

## 2012-12-02 NOTE — Telephone Encounter (Signed)
RX sent electronically to [facility] in [place]. Patient notified.

## 2012-12-09 ENCOUNTER — Ambulatory Visit (INDEPENDENT_AMBULATORY_CARE_PROVIDER_SITE_OTHER): Payer: BC Managed Care – HMO | Admitting: Family Medicine

## 2012-12-09 ENCOUNTER — Encounter: Payer: Self-pay | Admitting: Family Medicine

## 2012-12-09 VITALS — BP 142/78 | Ht 63.0 in | Wt 263.8 lb

## 2012-12-09 DIAGNOSIS — E119 Type 2 diabetes mellitus without complications: Secondary | ICD-10-CM

## 2012-12-09 DIAGNOSIS — E785 Hyperlipidemia, unspecified: Secondary | ICD-10-CM

## 2012-12-09 DIAGNOSIS — Z23 Encounter for immunization: Secondary | ICD-10-CM

## 2012-12-09 DIAGNOSIS — E039 Hypothyroidism, unspecified: Secondary | ICD-10-CM

## 2012-12-09 DIAGNOSIS — I10 Essential (primary) hypertension: Secondary | ICD-10-CM

## 2012-12-09 DIAGNOSIS — Z79899 Other long term (current) drug therapy: Secondary | ICD-10-CM

## 2012-12-09 LAB — POCT GLYCOSYLATED HEMOGLOBIN (HGB A1C): Hemoglobin A1C: 10.8

## 2012-12-09 MED ORDER — LEVOTHYROXINE SODIUM 125 MCG PO TABS: 125.0000 ug | ORAL_TABLET | Freq: Every day | ORAL | Status: DC

## 2012-12-09 MED ORDER — GLIMEPIRIDE 4 MG PO TABS: ORAL_TABLET | ORAL | Status: DC

## 2012-12-09 MED ORDER — METFORMIN HCL ER 500 MG PO TB24: ORAL_TABLET | ORAL | Status: DC

## 2012-12-09 MED ORDER — INSULIN GLARGINE 100 UNIT/ML SOLOSTAR PEN: 10.0000 [IU] | PEN_INJECTOR | Freq: Every day | SUBCUTANEOUS | Status: DC

## 2012-12-09 MED ORDER — LISINOPRIL-HYDROCHLOROTHIAZIDE 20-12.5 MG PO TABS: 1.0000 | ORAL_TABLET | Freq: Every day | ORAL | Status: DC

## 2012-12-09 MED ORDER — PRAVASTATIN SODIUM 40 MG PO TABS: 40.0000 mg | ORAL_TABLET | Freq: Every day | ORAL | Status: DC

## 2012-12-09 MED ORDER — KETOCONAZOLE 2 % EX CREA: TOPICAL_CREAM | Freq: Every day | CUTANEOUS | Status: DC

## 2012-12-09 NOTE — Progress Notes (Signed)
  Subjective:    Patient ID: Alicia Horne, female    DOB: Oct 21, 1965, 47 y.o.   MRN: [id number]  Diabetes She has type 2 diabetes mellitus. Her disease course has been worsening. Pertinent negatives for hypoglycemia include no dizziness. Pertinent negatives for diabetes include no blurred vision and no chest pain. Pertinent negatives for hypoglycemia complications include no blackouts. Symptoms are worsening. Her weight is increasing steadily. She is following a high fat/cholesterol diet. Meal planning includes avoidance of concentrated sweets. She participates in exercise three times a week. Her home blood glucose trend is increasing steadily. Her breakfast blood glucose is taken between 8-9 am. Her breakfast blood glucose range is generally 180-200 mg/dl. An ACE inhibitor/angiotensin II receptor blocker is being taken. Eye exam is not current.   Patient is here today for a check up. She hasn't been here since January.  Her blood sugars have been high (above 140's).  Concerned about her hemorrhoids.  Results for orders placed in visit on 12/09/12  POCT GLYCOSYLATED HEMOGLOBIN (HGB A1C)      Result Value Range   Hemoglobin A1C 10.8      Notes frequent urination. Also some blurred vision.  Stopped the toes it did not fill is helping her.  Claims compliance with her blood pressure medicine and lipid medicine.      Review of Systems  Eyes: Negative for blurred vision.  Cardiovascular: Negative for chest pain.  Neurological: Negative for dizziness.       Objective:   Physical Exam Alert HEENT normal. Vital stable. Lungs clear. Heart regular rate and rhythm. Ankles without edema.       Assessment & Plan:  Impression #1 type 2 diabetes poor control with element of noncompliance discussed #2 hyperlipidemia status uncertain. #3 hypertension decent control. #4 hyperlipidemia status uncertain. Plan initiate Lantus rationale discussed. Call us in a couple weeks with overall results.  Check appropriate blood work. That exercise discussed. Recheck in several months. WSL

## 2012-12-13 ENCOUNTER — Telehealth: Payer: Self-pay | Admitting: Family Medicine

## 2012-12-13 MED ORDER — HYDROCORTISONE ACETATE 25 MG RE SUPP: 25.0000 mg | Freq: Three times a day (TID) | RECTAL | Status: DC | PRN

## 2012-12-13 NOTE — Telephone Encounter (Signed)
Medication was sent in to pharmacy. Patient was notified.  

## 2012-12-13 NOTE — Addendum Note (Signed)
Addended by: Dereck Ligas on: 12/13/2012 11:59 AM   Modules accepted: Orders

## 2012-12-13 NOTE — Telephone Encounter (Signed)
anusol hc supp 21 one tid

## 2012-12-13 NOTE — Telephone Encounter (Signed)
Patients states hemorrhoids are worst.Can you call in something for them. Also she need work note for being out of work [employer] she will be returning. Call into [facility].

## 2012-12-18 DIAGNOSIS — E039 Hypothyroidism, unspecified: Secondary | ICD-10-CM | POA: Insufficient documentation

## 2012-12-22 ENCOUNTER — Telehealth: Payer: Self-pay | Admitting: Family Medicine

## 2012-12-22 NOTE — Telephone Encounter (Signed)
Go to 14 units then follow am readings then call next week with numbers . Dr.Steve can also advise her a protocol to follow but for now tell her the above.

## 2012-12-22 NOTE — Telephone Encounter (Addendum)
Patient advised to Go to 14 units then follow am readings then call next week with numbers. Patient verbalized understanding.

## 2012-12-22 NOTE — Telephone Encounter (Signed)
10 units lantus at bedtime

## 2012-12-22 NOTE — Telephone Encounter (Signed)
Lowest has been 233, the highest is 283 for the past 2 weeks on 10ml of insulin  Pt was to call with these numbers

## 2012-12-22 NOTE — Telephone Encounter (Signed)
Left message to return call 

## 2012-12-30 ENCOUNTER — Telehealth: Payer: Self-pay | Admitting: Family Medicine

## 2012-12-30 MED ORDER — HYDROCORTISONE 2.5 % RE CREA: 1.0000 "application " | TOPICAL_CREAM | Freq: Three times a day (TID) | RECTAL | Status: DC

## 2012-12-30 NOTE — Telephone Encounter (Signed)
Pt called to leave the sugar levels  Low 214 in AM High 233 in PM   On 14 units

## 2012-12-30 NOTE — Telephone Encounter (Signed)
Discussed with patient. Patient states she is having a lot of pain from a rectal fissure and would like a cream to help.

## 2012-12-30 NOTE — Telephone Encounter (Signed)
Go to 24 units daily call us in couple weeks with several fasting numbers

## 2012-12-30 NOTE — Telephone Encounter (Signed)
anusol hc cream apply tid. Also soften stools with one scoop of otc miralax daily

## 2012-12-30 NOTE — Telephone Encounter (Signed)
Rx sent electronically to pharmacy. Patient notified. 

## 2013-01-05 ENCOUNTER — Encounter: Payer: Self-pay | Admitting: Family Medicine

## 2013-01-05 ENCOUNTER — Ambulatory Visit (INDEPENDENT_AMBULATORY_CARE_PROVIDER_SITE_OTHER): Payer: BC Managed Care – HMO | Admitting: Family Medicine

## 2013-01-05 VITALS — BP 110/80 | Ht 63.0 in | Wt 266.0 lb

## 2013-01-05 DIAGNOSIS — E119 Type 2 diabetes mellitus without complications: Secondary | ICD-10-CM

## 2013-01-05 DIAGNOSIS — K644 Residual hemorrhoidal skin tags: Secondary | ICD-10-CM

## 2013-01-05 NOTE — Progress Notes (Signed)
  Subjective:    Patient ID: Alicia Horne, female    DOB: Jul 17, 1965, 47 y.o.   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 She states that she is experiencing some rectal pain and discomfort. It has been present for over a month now.   Wonders if may be a fistula. These usually start with diarrhea.  incr loose stools since   Patient states that her blood sugars are still running over 200 in the mornings.  History of hemorrhoids only in the past year. She has had 2 children.  Review of Systems No blood in stools loose stools at times no back pain no abdominal pain no major change in bowel habits    Objective:   Physical Exam  Alert no acute distress. Lungs clear. Heart regular in rhythm. Rectal and perirectal exam reveals thrombosed hemorrhoid. This is at least 32 days old.      Assessment & Plan:  Impression thrombosed hemorrhoid discussed to wait for surgical excision numbers to suboptimum control diabetes discussed plan titration Lantus dose discussed and written down patient instructions. Use Anusol a.c. 3 times a day. If persists as far as hemorrhoids may at one point need further intervention discussed. WSL

## 2013-01-05 NOTE — Patient Instructions (Signed)
Go to thirty units each evening with the lantus.  Going forward, may add three units each evening  Increasing the dose every five days, if:  Most of your morning sugars exceed 130

## 2013-02-03 ENCOUNTER — Telehealth: Payer: Self-pay | Admitting: Family Medicine

## 2013-02-03 ENCOUNTER — Other Ambulatory Visit: Payer: Self-pay | Admitting: Family Medicine

## 2013-02-03 MED ORDER — INSULIN GLARGINE 100 UNIT/ML SOLOSTAR PEN: 50.0000 [IU] | PEN_INJECTOR | Freq: Every day | SUBCUTANEOUS | Status: DC

## 2013-02-03 NOTE — Telephone Encounter (Signed)
Has been on 50 units of Insulin for the last 3 days. Back in November, was instructed to start on 10 units qhs. If morning fasting sugar was more than 130, was to go up by 3 units every 5 days. She has ran out of insulin as a result. I have sent refills in and she says her morning sugars have been 130s the last 3 days so may be at appropriate dose. I have changed to 50 units in medication list and sent refills. Have instructed her to contact her PCP Dr. Lubertha South on Monday to give him updates. She voiced understanding and was appreciative.

## 2013-03-09 ENCOUNTER — Telehealth: Payer: Self-pay | Admitting: Family Medicine

## 2013-03-09 NOTE — Telephone Encounter (Signed)
Please see telephone message on 02/03/13 from Dr. Lorra Hals at [facility].

## 2013-03-09 NOTE — Telephone Encounter (Signed)
Pt needs refill on her Lantus, please adjust Rx for her sliding scale (last Rx wasn't enough for the month), ?'s please call pt, pt uses [facility], please call pt when done [phone]

## 2013-03-09 NOTE — Telephone Encounter (Signed)
Nurses, call pt, need update. Current dose lantus? Has she reached goal with sugar control? What are the numbers? If morn numb in good shap (most less than 130) have pt cont that dose, figure out how much she needs per mo and rx it. Pt definietly need to f u next mo

## 2013-03-10 ENCOUNTER — Ambulatory Visit: Payer: BC Managed Care – HMO | Admitting: Family Medicine

## 2013-03-10 ENCOUNTER — Other Ambulatory Visit: Payer: Self-pay | Admitting: *Deleted

## 2013-03-10 MED ORDER — INSULIN GLARGINE 100 UNIT/ML SOLOSTAR PEN: PEN_INJECTOR | SUBCUTANEOUS | Status: DC

## 2013-03-10 NOTE — Telephone Encounter (Signed)
Discussed with patient. She has follow up office visti. Med sent to [facility].

## 2013-03-10 NOTE — Telephone Encounter (Signed)
Left message on voicemail to return call.

## 2013-03-10 NOTE — Telephone Encounter (Signed)
Patient states that she is currently taking 62 units daily of Lantus, glimepiride 4 mg BID, and metformin 500 mg 2 tabs am, 1 tab at noon, 2 tabs pm. She is not able to reach the goal of 130. Her sugars are ranging between 154 (lowest) and 172 (highest). She is taking her medication as prescribed and making sure not to miss any doses.

## 2013-03-10 NOTE — Telephone Encounter (Signed)
Increase lantus to 68, if in seven days, morning sugars are still above 130, go to 74. Ov in two wks

## 2013-03-28 LAB — BASIC METABOLIC PANEL
BUN: 14 mg/dL (ref 6–23)
CO2: 26 mEq/L (ref 19–32)
Calcium: 9.1 mg/dL (ref 8.4–10.5)
Chloride: 104 mEq/L (ref 96–112)
Creat: 0.84 mg/dL (ref 0.50–1.10)
Glucose, Bld: 133 mg/dL — ABNORMAL HIGH (ref 70–99)
Potassium: 3.9 mEq/L (ref 3.5–5.3)
Sodium: 137 mEq/L (ref 135–145)

## 2013-03-28 LAB — HEPATIC FUNCTION PANEL
ALT: 51 U/L — ABNORMAL HIGH (ref 0–35)
AST: 42 U/L — ABNORMAL HIGH (ref 0–37)
Albumin: 3.6 g/dL (ref 3.5–5.2)
Alkaline Phosphatase: 70 U/L (ref 39–117)
Bilirubin, Direct: 0.1 mg/dL (ref 0.0–0.3)
Indirect Bilirubin: 0.2 mg/dL (ref 0.2–1.2)
Total Bilirubin: 0.3 mg/dL (ref 0.2–1.2)
Total Protein: 6.7 g/dL (ref 6.0–8.3)

## 2013-03-28 LAB — LIPID PANEL
Cholesterol: 176 mg/dL (ref 0–200)
HDL: 41 mg/dL (ref >39)
LDL Cholesterol: 109 mg/dL — ABNORMAL HIGH (ref 0–99)
Total CHOL/HDL Ratio: 4.3 Ratio
Triglycerides: 132 mg/dL (ref <150)
VLDL: 26 mg/dL (ref 0–40)

## 2013-03-29 LAB — TSH: TSH: 8.346 u[IU]/mL — ABNORMAL HIGH (ref 0.350–4.500)

## 2013-03-29 LAB — MICROALBUMIN, URINE: Microalb, Ur: 0.7 mg/dL (ref 0.00–1.89)

## 2013-04-07 ENCOUNTER — Ambulatory Visit (INDEPENDENT_AMBULATORY_CARE_PROVIDER_SITE_OTHER): Payer: BC Managed Care – HMO | Admitting: Family Medicine

## 2013-04-07 ENCOUNTER — Encounter: Payer: Self-pay | Admitting: Family Medicine

## 2013-04-07 VITALS — BP 140/70 | Ht 63.0 in | Wt 277.0 lb

## 2013-04-07 DIAGNOSIS — E039 Hypothyroidism, unspecified: Secondary | ICD-10-CM

## 2013-04-07 DIAGNOSIS — E785 Hyperlipidemia, unspecified: Secondary | ICD-10-CM

## 2013-04-07 DIAGNOSIS — E119 Type 2 diabetes mellitus without complications: Secondary | ICD-10-CM

## 2013-04-07 DIAGNOSIS — I10 Essential (primary) hypertension: Secondary | ICD-10-CM

## 2013-04-07 LAB — POCT GLYCOSYLATED HEMOGLOBIN (HGB A1C): Hemoglobin A1C: 7.8

## 2013-04-07 MED ORDER — LISINOPRIL-HYDROCHLOROTHIAZIDE 20-12.5 MG PO TABS: 1.0000 | ORAL_TABLET | Freq: Every day | ORAL | Status: DC

## 2013-04-07 MED ORDER — METFORMIN HCL ER 500 MG PO TB24: ORAL_TABLET | ORAL | Status: DC

## 2013-04-07 MED ORDER — GLIMEPIRIDE 4 MG PO TABS: ORAL_TABLET | ORAL | Status: DC

## 2013-04-07 MED ORDER — LEVOTHYROXINE SODIUM 137 MCG PO TABS: 137.0000 ug | ORAL_TABLET | Freq: Every day | ORAL | Status: DC

## 2013-04-07 MED ORDER — PRAVASTATIN SODIUM 40 MG PO TABS: 40.0000 mg | ORAL_TABLET | Freq: Every day | ORAL | Status: DC

## 2013-04-07 NOTE — Progress Notes (Signed)
   Subjective:    Patient ID: Alicia Horne, female    DOB: May 11, 1965, 48 y.o.   MRN: [id number]  Diabetes She presents for her follow-up diabetic visit. She has type 2 diabetes mellitus. Her disease course has been stable. There are no hypoglycemic associated symptoms. There are no diabetic associated symptoms. There are no hypoglycemic complications. Symptoms are stable. There are no diabetic complications. There are no known risk factors for coronary artery disease. Current diabetic treatment includes insulin injections. She is compliant with treatment all of the time.   Results for orders placed in visit on 04/07/13  POCT GLYCOSYLATED HEMOGLOBIN (HGB A1C)      Result Value Ref Range   Hemoglobin A1C 7.8     Patient has no concerns at this time.   Patient also had blood work to look at other details. See results. Thyroid suboptimum. Patient claims complete compliance with medication.  No low sugar spells.  Claims full compliance with her lipid medication. Trying to watch her diet. Not exercising at this time.  Patient does note chronic fatigue and tiredness most days. Some constipation at times.  Review of Systems No headache no chest pain no back pain no abdominal pain no change about habits no rash ROS otherwise negative    Objective:   Physical Exam Alert no apparent distress. HEENT normal. Lungs clear. Heart regular in rhythm. Blood pressure similar on repeat. C. foot exam       Assessment & Plan:  Impression #1 type 2 diabetes suboptimum control. Discussed at length. #2 hyperlipidemia good control. #3 hypothyroidism suboptimum. Plan exercise encourage. Diet discussed. Adjust Lantus as noted. Increase thyroid. Diet exercise discussed in encourage. WSL #4 hypertension good control.

## 2013-04-09 DIAGNOSIS — E785 Hyperlipidemia, unspecified: Secondary | ICD-10-CM | POA: Insufficient documentation

## 2013-04-12 ENCOUNTER — Telehealth: Payer: Self-pay | Admitting: Family Medicine

## 2013-04-12 NOTE — Telephone Encounter (Signed)
We called in wrong Metformin.  Dr. Richardson Landry changed it from the extended release to the regular, pt states we were supposed to call in 1000mg  regular Metformin to [facility], please call pt when done

## 2013-04-13 MED ORDER — METFORMIN HCL 1000 MG PO TABS: ORAL_TABLET | ORAL | Status: DC

## 2013-04-13 NOTE — Telephone Encounter (Signed)
Patient has never had the plain before need dose and directions for the plain metformin please

## 2013-04-13 NOTE — Telephone Encounter (Signed)
Exactly as she's been taking the long acting, the exact same amount and timing

## 2013-04-13 NOTE — Telephone Encounter (Signed)
Change metformin to regular, i inherited pt on longacting form, she'd like to try the reg generic formulation

## 2013-04-13 NOTE — Telephone Encounter (Signed)
Rx sent electronically to Pharmacy. Patient notified.

## 2013-04-17 ENCOUNTER — Telehealth: Payer: Self-pay | Admitting: Family Medicine

## 2013-04-17 MED ORDER — INSULIN GLARGINE 100 UNIT/ML SOLOSTAR PEN: 80.0000 [IU] | PEN_INJECTOR | Freq: Every day | SUBCUTANEOUS | Status: DC

## 2013-04-17 NOTE — Telephone Encounter (Signed)
Left message on voicemail that medication has been sent in to the pharmacy.

## 2013-04-17 NOTE — Telephone Encounter (Signed)
Patient needs Rx for lantus 80 units 90 day supply    [facility]

## 2013-04-25 ENCOUNTER — Telehealth: Payer: Self-pay | Admitting: Family Medicine

## 2013-04-25 NOTE — Telephone Encounter (Signed)
Patient notified, med sent.  °

## 2013-04-25 NOTE — Telephone Encounter (Signed)
wis breeze

## 2013-04-25 NOTE — Telephone Encounter (Signed)
Pt's insurance plan prefers [employer] Contour or Breeze glucose test strips, please send order to [facility] for new meter and test strips, pt's testing 3x a day, please see request on front of paper chart

## 2013-07-05 ENCOUNTER — Ambulatory Visit (INDEPENDENT_AMBULATORY_CARE_PROVIDER_SITE_OTHER): Payer: BC Managed Care – HMO | Admitting: Family Medicine

## 2013-07-05 ENCOUNTER — Encounter: Payer: Self-pay | Admitting: Family Medicine

## 2013-07-05 VITALS — BP 128/80 | Ht 63.0 in | Wt 281.0 lb

## 2013-07-05 DIAGNOSIS — K7689 Other specified diseases of liver: Secondary | ICD-10-CM

## 2013-07-05 DIAGNOSIS — E119 Type 2 diabetes mellitus without complications: Secondary | ICD-10-CM

## 2013-07-05 DIAGNOSIS — I10 Essential (primary) hypertension: Secondary | ICD-10-CM

## 2013-07-05 DIAGNOSIS — K760 Fatty (change of) liver, not elsewhere classified: Secondary | ICD-10-CM | POA: Insufficient documentation

## 2013-07-05 DIAGNOSIS — E785 Hyperlipidemia, unspecified: Secondary | ICD-10-CM

## 2013-07-05 LAB — POCT GLYCOSYLATED HEMOGLOBIN (HGB A1C): Hemoglobin A1C: 8.6

## 2013-07-05 MED ORDER — INSULIN GLARGINE 100 UNIT/ML SOLOSTAR PEN: 80.0000 [IU] | PEN_INJECTOR | Freq: Every day | SUBCUTANEOUS | Status: DC

## 2013-07-05 MED ORDER — LEVOTHYROXINE SODIUM 137 MCG PO TABS: 137.0000 ug | ORAL_TABLET | Freq: Every day | ORAL | Status: DC

## 2013-07-05 MED ORDER — LISINOPRIL-HYDROCHLOROTHIAZIDE 20-12.5 MG PO TABS: 1.0000 | ORAL_TABLET | Freq: Every day | ORAL | Status: DC

## 2013-07-05 MED ORDER — PRAVASTATIN SODIUM 40 MG PO TABS: 40.0000 mg | ORAL_TABLET | Freq: Every day | ORAL | Status: DC

## 2013-07-05 MED ORDER — METFORMIN HCL 1000 MG PO TABS: ORAL_TABLET | ORAL | Status: DC

## 2013-07-05 MED ORDER — GLIMEPIRIDE 4 MG PO TABS: ORAL_TABLET | ORAL | Status: DC

## 2013-07-05 NOTE — Progress Notes (Signed)
   Subjective:    Patient ID: Alicia Horne, female    DOB: [birth year], 48 y.o.   MRN: [id number]  Diabetes She presents for her follow-up diabetic visit. She has type 2 diabetes mellitus. She is compliant with treatment all of the time. She is currently taking insulin at bedtime. Insulin injections are given by patient. Her breakfast blood glucose range is generally 130-140 mg/dl. She does not see a podiatrist.Eye exam is current.   Patient wants to discuss her kidney doctor moving to [place]. She has a follow up with him in July. History of renal cell carcinoma.  BP generally runs very good. Compliant with blood pressure medications. Realizes sugars are going in the wrong direction. Claims compliance with the Lantus.  Exercising not much, rarely walks Hoping to start soon  No kidney dr for a follow up, Exercised in the past, walked, enjoyed it  Review of Systems No headache no chest pain no back pain no abdominal pain no change in bowel habits no blood in stool ROS otherwise negative    Objective:   Physical Exam Alert no apparent distress. Lungs clear. Heart regular in rhythm. H&T normal. Feet pulses good sensation intact no significant edema.       Assessment & Plan:  Impression 1 hypertension good control. #2 type 2 diabetes worsening control discussed at great length. #3 history of renal cell carcinoma. Wonders if she needs a urologist at this point. Plan titrate up Lantus. Rationale discussed. Strongly encouraged to followup with urologist. 25 minutes spent most in discussion. If reaches 100 units insulin and sugars still not good call us. WSL

## 2013-07-05 NOTE — Patient Instructions (Signed)
Add 6 units of insulin to your lantus dose, in two weeks if sugars are not mostly below 120, go ahead and add 6 more.If two wks after that, not there yet, add 6 more once again   Please start exercising regularly

## 2013-09-01 ENCOUNTER — Encounter: Payer: Self-pay | Admitting: Family Medicine

## 2013-10-04 ENCOUNTER — Encounter: Payer: Self-pay | Admitting: Family Medicine

## 2013-10-04 ENCOUNTER — Ambulatory Visit (INDEPENDENT_AMBULATORY_CARE_PROVIDER_SITE_OTHER): Payer: BC Managed Care – HMO | Admitting: Family Medicine

## 2013-10-04 VITALS — BP 138/96 | Ht 63.0 in | Wt 287.0 lb

## 2013-10-04 DIAGNOSIS — K7689 Other specified diseases of liver: Secondary | ICD-10-CM

## 2013-10-04 DIAGNOSIS — K760 Fatty (change of) liver, not elsewhere classified: Secondary | ICD-10-CM

## 2013-10-04 DIAGNOSIS — E785 Hyperlipidemia, unspecified: Secondary | ICD-10-CM

## 2013-10-04 DIAGNOSIS — E669 Obesity, unspecified: Secondary | ICD-10-CM

## 2013-10-04 DIAGNOSIS — I10 Essential (primary) hypertension: Secondary | ICD-10-CM

## 2013-10-04 DIAGNOSIS — E119 Type 2 diabetes mellitus without complications: Secondary | ICD-10-CM

## 2013-10-04 LAB — POCT GLYCOSYLATED HEMOGLOBIN (HGB A1C): Hemoglobin A1C: 8

## 2013-10-04 MED ORDER — GLIMEPIRIDE 4 MG PO TABS: ORAL_TABLET | ORAL | Status: DC

## 2013-10-04 MED ORDER — INSULIN GLARGINE 100 UNIT/ML SOLOSTAR PEN: 60.0000 [IU] | PEN_INJECTOR | Freq: Two times a day (BID) | SUBCUTANEOUS | Status: DC

## 2013-10-04 NOTE — Progress Notes (Signed)
   Subjective:    Patient ID: Alicia Horne, female    DOB: [birth year], 48 y.o.   MRN: [id number]  Diabetes She presents for her follow-up diabetic visit. She has type 2 diabetes mellitus. Hypoglycemia symptoms include hunger, nervousness/anxiousness and tremors. Associated symptoms include blurred vision and polydipsia. Current diabetic treatment includes insulin injections and oral agent (monotherapy). She is compliant with treatment all of the time. She is currently taking insulin at bedtime and pre-breakfast. She never participates in exercise. She monitors blood glucose at home 1-2 x per day. Her overall blood glucose range is 110-130 mg/dl. She sees a podiatrist.Eye exam is not current.   Has now started splitting the dose  On 98 morn 54 at night  Some morns a little low, Patient claims compliance with medication.  Not dieting as well as she had hoped.   Compliant with her lipid medication. Trying to watch her diet. No obvious side effects.  Compliant with thyroid medicine. No symptoms of high low thyroid. Claims compliance.  Compliant with blood pressure medicine. Blood pressure generally in good control.     Review of Systems  Eyes: Positive for blurred vision.  Endocrine: Positive for polydipsia.  Neurological: Positive for tremors.  Psychiatric/Behavioral: The patient is nervous/anxious.        Objective:   Physical Exam  Alert somewhat anxious no acute distress. Vitals stable. HEENT normal. Lungs clear. Heart rare rhythm. Ankles without edema. See diabetic foot exam      Assessment & Plan:  Impression 1 hypertension good control #2 type 2 diabetes.this Results for orders placed in visit on 10/04/13  POCT GLYCOSYLATED HEMOGLOBIN (HGB A1C)      Result Value Ref Range   Hemoglobin A1C 8.0     #3 hypothyroidism status uncertain. #4 hyperlipidemia status uncertain. #5 morbid obesity patient plans to seek out bariatric intervention and I think this is a good  idea. Discussed at length. Plan adjust Amaryl tablets just one half in the evening. Increase Lantus. Rationale discussed. This is in hopes of decreasing morning low sugars. Blood work. Further recommendations based results. Recheck as scheduled. WSL

## 2013-10-05 ENCOUNTER — Other Ambulatory Visit: Payer: Self-pay | Admitting: *Deleted

## 2013-10-05 ENCOUNTER — Telehealth: Payer: Self-pay | Admitting: *Deleted

## 2013-10-05 MED ORDER — GLIMEPIRIDE 4 MG PO TABS: ORAL_TABLET | ORAL | Status: DC

## 2013-10-05 MED ORDER — FLUCONAZOLE 150 MG PO TABS: ORAL_TABLET | ORAL | Status: DC

## 2013-10-05 NOTE — Telephone Encounter (Signed)
Pt reqesting med for yeast infectio. Having vaginal itchin started today. diflcucan 150mg  one po 3 days apart. #2. Sent to [facility] [place].

## 2013-10-06 ENCOUNTER — Ambulatory Visit: Payer: BC Managed Care – HMO | Admitting: Family Medicine

## 2013-12-31 LAB — HEPATIC FUNCTION PANEL
ALT: 69 U/L — ABNORMAL HIGH (ref 0–35)
AST: 57 U/L — ABNORMAL HIGH (ref 0–37)
Albumin: 3.6 g/dL (ref 3.5–5.2)
Alkaline Phosphatase: 64 U/L (ref 39–117)
BILIRUBIN TOTAL: 0.3 mg/dL (ref 0.2–1.2)
Bilirubin, Direct: 0.1 mg/dL (ref 0.0–0.3)
Indirect Bilirubin: 0.2 mg/dL (ref 0.2–1.2)
Total Protein: 6.8 g/dL (ref 6.0–8.3)

## 2013-12-31 LAB — LIPID PANEL
CHOL/HDL RATIO: 4.5 ratio
Cholesterol: 191 mg/dL (ref 0–200)
HDL: 42 mg/dL (ref >39)
LDL Cholesterol: 127 mg/dL — ABNORMAL HIGH (ref 0–99)
Triglycerides: 111 mg/dL (ref <150)
VLDL: 22 mg/dL (ref 0–40)

## 2013-12-31 LAB — TSH: TSH: 2.293 u[IU]/mL (ref 0.350–4.500)

## 2014-01-01 ENCOUNTER — Ambulatory Visit: Payer: BC Managed Care – HMO | Admitting: Family Medicine

## 2014-01-04 ENCOUNTER — Ambulatory Visit (INDEPENDENT_AMBULATORY_CARE_PROVIDER_SITE_OTHER): Payer: BC Managed Care – HMO | Admitting: Family Medicine

## 2014-01-04 ENCOUNTER — Encounter: Payer: Self-pay | Admitting: Family Medicine

## 2014-01-04 VITALS — BP 136/84 | Ht 63.0 in | Wt 290.2 lb

## 2014-01-04 DIAGNOSIS — K760 Fatty (change of) liver, not elsewhere classified: Secondary | ICD-10-CM

## 2014-01-04 DIAGNOSIS — E119 Type 2 diabetes mellitus without complications: Secondary | ICD-10-CM

## 2014-01-04 DIAGNOSIS — E785 Hyperlipidemia, unspecified: Secondary | ICD-10-CM

## 2014-01-04 DIAGNOSIS — Z23 Encounter for immunization: Secondary | ICD-10-CM

## 2014-01-04 DIAGNOSIS — E038 Other specified hypothyroidism: Secondary | ICD-10-CM

## 2014-01-04 DIAGNOSIS — I10 Essential (primary) hypertension: Secondary | ICD-10-CM

## 2014-01-04 LAB — POCT GLYCOSYLATED HEMOGLOBIN (HGB A1C): HEMOGLOBIN A1C: 7.3

## 2014-01-04 MED ORDER — LISINOPRIL-HYDROCHLOROTHIAZIDE 20-12.5 MG PO TABS: 1.0000 | ORAL_TABLET | Freq: Every day | ORAL | Status: DC

## 2014-01-04 MED ORDER — LEVOTHYROXINE SODIUM 137 MCG PO TABS: 137.0000 ug | ORAL_TABLET | Freq: Every day | ORAL | Status: DC

## 2014-01-04 MED ORDER — GLIMEPIRIDE 4 MG PO TABS: ORAL_TABLET | ORAL | Status: DC

## 2014-01-04 MED ORDER — INSULIN GLARGINE 100 UNIT/ML SOLOSTAR PEN: 60.0000 [IU] | PEN_INJECTOR | Freq: Two times a day (BID) | SUBCUTANEOUS | Status: DC

## 2014-01-04 MED ORDER — PRAVASTATIN SODIUM 80 MG PO TABS: 80.0000 mg | ORAL_TABLET | Freq: Every day | ORAL | Status: DC

## 2014-01-04 MED ORDER — METFORMIN HCL 1000 MG PO TABS: ORAL_TABLET | ORAL | Status: DC

## 2014-01-04 NOTE — Progress Notes (Signed)
   Subjective:    Patient ID: Alicia Horne, female    DOB: January 08, 1966, 49 y.o.   MRN: [id number]  Diabetes She presents for her follow-up diabetic visit. She has type 2 diabetes mellitus. Risk factors for coronary artery disease include diabetes mellitus, hypertension and obesity. Current diabetic treatment includes insulin injections and oral agent (dual therapy). She is compliant with treatment all of the time. Her weight is stable. She is following a diabetic diet. She has had a previous visit with a dietitian. She participates in exercise intermittently. She does not see a podiatrist.Eye exam is current.   Results for orders placed or performed in visit on 01/04/14  POCT glycosylated hemoglobin (Hb A1C)  Result Value Ref Range   Hemoglobin A1C 7.3     Walking regulaly A1c wlking  Low sugar spells less  Eye doc visit soon  Patient compliant with lipid medication. Numbers recently checked. No obvious side effects with medicine.  Patient compliant with thyroid medicine. Does not miss a dose. No symptoms of high or low thyroid.  Compliant with blood pressure medication. Watching salt intake. No obvious side effects  Review of Systems No headache no chest pain no back pain no abdominal pain no change in bowel habits no blood in stool ROS otherwise negative    Objective:   Physical Exam Alert no apparent distress. HEENT normal. Lungs clear. Heart regular in rhythm. Feet C exam       Assessment & Plan:  Impression type 2 diabetes control improving though not ideal #2 hypertension good control #3 hyperlipidemia control improving though not ideal discussed #4 more morbid obesity patient working on this. Will look toward his bariatric intervention. Currently in the midst of intensive counseling plan maintain same medications. Flu shot today. Diet exercise discussed. Recheck in several months. WSL

## 2014-01-08 ENCOUNTER — Telehealth: Payer: Self-pay | Admitting: Family Medicine

## 2014-01-08 NOTE — Telephone Encounter (Signed)
Discussed with patient. Patient verbalized understanding. 

## 2014-01-08 NOTE — Telephone Encounter (Signed)
Inform pt will have printed on letterhead tomorrow, can pick up at front window then

## 2014-01-08 NOTE — Telephone Encounter (Signed)
Calling to inquire on the letter that Dr. Richardson Landry was to write her this weekend for her weight loss surgery.  She wants to know if this is done so she can pick up today.

## 2014-01-24 ENCOUNTER — Encounter: Payer: Self-pay | Admitting: Family Medicine

## 2014-01-24 ENCOUNTER — Ambulatory Visit (INDEPENDENT_AMBULATORY_CARE_PROVIDER_SITE_OTHER): Payer: BC Managed Care – HMO | Admitting: Family Medicine

## 2014-01-24 DIAGNOSIS — R509 Fever, unspecified: Secondary | ICD-10-CM

## 2014-01-24 DIAGNOSIS — J020 Streptococcal pharyngitis: Secondary | ICD-10-CM

## 2014-01-24 DIAGNOSIS — J029 Acute pharyngitis, unspecified: Secondary | ICD-10-CM

## 2014-01-24 LAB — POCT RAPID STREP A (OFFICE): Rapid Strep A Screen: POSITIVE — AB

## 2014-01-24 MED ORDER — CEFTRIAXONE SODIUM 1 G IJ SOLR
500.0000 mg | Freq: Once | INTRAMUSCULAR | Status: AC
  Administered 2014-01-24: 500 mg via INTRAMUSCULAR

## 2014-01-24 MED ORDER — AMOXICILLIN 500 MG PO TABS: 500.0000 mg | ORAL_TABLET | Freq: Three times a day (TID) | ORAL | Status: DC

## 2014-01-24 NOTE — Progress Notes (Signed)
   Subjective:    Patient ID: Alicia Horne, female    DOB: 02-Jul-1965, 48 y.o.   MRN: [id number]  HPI Patient with severe sore throat present over the past few days low-grade fever and chills denies nausea vomiting relates moderate headache doesn't feel well fatigued and tired no wheezing or difficulty breathing. PMH benign/diabetic   Review of Systems see above    Objective:   Physical Exam Tonsils are swollen with exudate airway is patent not respiratory distress Neck is supple some adenopathy lungs are clear heart is regular patient not toxic       Assessment & Plan:  Positive strep throat-ibuprofen for discomfort and fever, antibiotics prescribed, shot of antibiotics as well warning signs discussed

## 2014-02-23 LAB — HM DIABETES EYE EXAM

## 2014-03-01 ENCOUNTER — Other Ambulatory Visit: Payer: Self-pay | Admitting: *Deleted

## 2014-03-01 ENCOUNTER — Encounter: Payer: Self-pay | Admitting: Family Medicine

## 2014-03-01 MED ORDER — FLUCONAZOLE 150 MG PO TABS: 150.0000 mg | ORAL_TABLET | Freq: Every day | ORAL | Status: DC

## 2014-03-01 NOTE — Telephone Encounter (Signed)
MyChart message: I have been going to the dermatologist for my rosacea they have prescribe doxycyline 50mg  once a day from now on to control it. Is this something you could prescribe for so i could only go to dermatologist when needed. If so please send to the pharmacy refill for 90 days like others. Thank you [facility] in [place].

## 2014-03-31 ENCOUNTER — Telehealth: Payer: Self-pay | Admitting: Family

## 2014-03-31 DIAGNOSIS — R059 Cough, unspecified: Secondary | ICD-10-CM

## 2014-03-31 DIAGNOSIS — J069 Acute upper respiratory infection, unspecified: Secondary | ICD-10-CM

## 2014-03-31 DIAGNOSIS — R05 Cough: Secondary | ICD-10-CM

## 2014-03-31 MED ORDER — BENZONATATE 100 MG PO CAPS: 100.0000 mg | ORAL_CAPSULE | Freq: Three times a day (TID) | ORAL | Status: DC | PRN

## 2014-03-31 MED ORDER — AZITHROMYCIN 250 MG PO TABS: ORAL_TABLET | ORAL | Status: DC

## 2014-03-31 NOTE — Progress Notes (Signed)
We are sorry that you are not feeling well.  Here is how we plan to help!  Based on what you have shared with me it looks like you have upper respiratory tract inflammation that has resulted in a significant cough.  Inflammation and infection in the upper respiratory tract is commonly called bronchitis and has four common causes:  Allergies, Viral Infections, Acid Reflux and Bacterial Infections.  Allergies, viruses and acid reflux are treated by controlling symptoms or eliminating the cause. An example might be a cough caused by taking certain blood pressure medications. You stop the cough by changing the medication. Another example might be a cough caused by acid reflux. Controlling the reflux helps control the cough.  Based on your presentation I believe you most likely have A cough due to bacteria.  When patients have a fever and a productive cough with a change in color or increased sputum production, we are concerned about bacterial bronchitis.  If left untreated it can progress to pneumonia.  If your symptoms do not improve with your treatment plan it is important that you contact your provider.   I have prescribed Azithromyin 250 mg: two tables now and then one tablet daily for 4 additonal days   In addition you may use A non-prescription cough medication called Robitussin DAC. Take 2 teaspoons every 8 hours or Delsym: take 2 teaspoons every 12 hours., A non-prescription cough medication called Mucinex DM: take 2 tablets every 12 hours. and A prescription cough medication called Tessalon Perles 100mg. You may take 1-2 capsules every 8 hours as needed for your cough.    HOME CARE . Only take medications as instructed by your medical team. . Complete the entire course of an antibiotic. . Drink plenty of fluids and get plenty of rest. . Avoid close contacts especially the very young and the elderly . Cover your mouth if you cough or cough into your sleeve. . Always remember to wash your hands . A  steam or ultrasonic humidifier can help congestion.    GET HELP RIGHT AWAY IF: . You develop worsening fever. . You become short of breath . You cough up blood. . Your symptoms persist after you have completed your treatment plan MAKE SURE YOU   Understand these instructions.  Will watch your condition.  Will get help right away if you are not doing well or get worse.  Your e-visit answers were reviewed by a board certified advanced clinical practitioner to complete your personal care plan.  Depending on the condition, your plan could have included both over the counter or prescription medications.  If there is a problem please reply  once you have received a response from your provider.  Your safety is important to us.  If you have drug allergies check your prescription carefully.    You can use MyChart to ask questions about today's visit, request a non-urgent call back, or ask for a work or school excuse.  You will get an e-mail in the next two days asking about your experience.  I hope that your e-visit has been valuable and will speed your recovery. Thank you for using e-visits.   

## 2014-04-04 ENCOUNTER — Encounter: Payer: Self-pay | Admitting: Family Medicine

## 2014-04-04 ENCOUNTER — Ambulatory Visit (INDEPENDENT_AMBULATORY_CARE_PROVIDER_SITE_OTHER): Payer: PRIVATE HEALTH INSURANCE | Admitting: Family Medicine

## 2014-04-04 VITALS — BP 138/72 | Temp 98.8°F | Ht 63.0 in | Wt 300.0 lb

## 2014-04-04 DIAGNOSIS — J4520 Mild intermittent asthma, uncomplicated: Secondary | ICD-10-CM

## 2014-04-04 DIAGNOSIS — J410 Simple chronic bronchitis: Secondary | ICD-10-CM

## 2014-04-04 DIAGNOSIS — J683 Other acute and subacute respiratory conditions due to chemicals, gases, fumes and vapors: Secondary | ICD-10-CM

## 2014-04-04 MED ORDER — PREDNISONE 10 MG PO TABS: ORAL_TABLET | ORAL | Status: DC

## 2014-04-04 MED ORDER — ALBUTEROL SULFATE HFA 108 (90 BASE) MCG/ACT IN AERS: 2.0000 | INHALATION_SPRAY | Freq: Four times a day (QID) | RESPIRATORY_TRACT | Status: DC | PRN

## 2014-04-04 MED ORDER — LEVOFLOXACIN 500 MG PO TABS: 500.0000 mg | ORAL_TABLET | Freq: Every day | ORAL | Status: DC

## 2014-04-04 NOTE — Progress Notes (Signed)
   Subjective:    Patient ID: Alicia Horne, female    DOB: [birth year], 49 y.o.   MRN: [id number]  Cough This is a new problem. The current episode started more than 1 month ago (6 weeks ago). Associated symptoms include chest pain, headaches and wheezing. Associated symptoms comments: Back pain. Treatments tried: antibiotics, mucinex DM, allegry meds, cough meds.   Pt requesting a refill on oxycodone because she is having back pain.   Did benzonate and azithromycin over the weekend via an e visit   e visit this weekend thru my chart,  Review of Systems  Respiratory: Positive for cough and wheezing.   Cardiovascular: Positive for chest pain.  Neurological: Positive for headaches.   No vomiting no diarrhea    Objective:   Physical Exam  Alert mild malaise HEENT mild nasal congestion lungs bilateral wheezes no tachypnea no inspiratory crackles some lateral chest wall discomfort with deep breath. And palpation      Assessment & Plan:  Impression persistent bronchitis with reactive airways plan stop Z-Pak. Start Levaquin. Prednisone taper. Albuterol 2 sprays 4 times a day proper use discussed. WSL

## 2014-04-06 ENCOUNTER — Ambulatory Visit: Payer: BC Managed Care – HMO | Admitting: Family Medicine

## 2014-04-13 ENCOUNTER — Ambulatory Visit (INDEPENDENT_AMBULATORY_CARE_PROVIDER_SITE_OTHER): Payer: PRIVATE HEALTH INSURANCE | Admitting: Family Medicine

## 2014-04-13 ENCOUNTER — Encounter: Payer: Self-pay | Admitting: Family Medicine

## 2014-04-13 VITALS — BP 142/68 | Ht 63.0 in | Wt 302.0 lb

## 2014-04-13 DIAGNOSIS — E785 Hyperlipidemia, unspecified: Secondary | ICD-10-CM

## 2014-04-13 DIAGNOSIS — J4520 Mild intermittent asthma, uncomplicated: Secondary | ICD-10-CM

## 2014-04-13 DIAGNOSIS — J683 Other acute and subacute respiratory conditions due to chemicals, gases, fumes and vapors: Secondary | ICD-10-CM

## 2014-04-13 DIAGNOSIS — I10 Essential (primary) hypertension: Secondary | ICD-10-CM

## 2014-04-13 DIAGNOSIS — E119 Type 2 diabetes mellitus without complications: Secondary | ICD-10-CM

## 2014-04-13 LAB — POCT GLYCOSYLATED HEMOGLOBIN (HGB A1C): HEMOGLOBIN A1C: 7.6

## 2014-04-13 NOTE — Progress Notes (Signed)
   Subjective:    Patient ID: Alicia Horne, female    DOB: 01-31-1966, 49 y.o.   MRN: [id number]  Diabetes She presents for her follow-up diabetic visit. She has type 2 diabetes mellitus. Her disease course has been stable. There are no hypoglycemic associated symptoms. There are no diabetic associated symptoms. There are no hypoglycemic complications. Symptoms are stable. There are no diabetic complications. There are no known risk factors for coronary artery disease. Current diabetic treatment includes oral agent (monotherapy). She is compliant with treatment all of the time.   Patient states that she has no other concerns at this time.  Results for orders placed or performed in visit on 04/13/14  POCT glycosylated hemoglobin (Hb A1C)  Result Value Ref Range   Hemoglobin A1C 7.6    Comp with bp meds no obv s e's/  Watching salt intake  Sugars up lately with burst of steroids, also elev in recent wks with sickness  Working towards bariatric intervention,  New current insurance does not cover bariatric under new insur "PCS" not covering   Compliant with blood pressure medication. Watching salt intake. No obvious side effects from the medicine.  Notes breathing and wheezing has improved considerably. Occasional congestion drainage.  Review of Systems No headache no chest pain no back pain abdominal pain no change in bowel habits no blood in stool ROS otherwise negative    Objective:   Physical Exam Alert morbid obesity present blood pressure improved on repeat. H&T slight nasal congestion. Lungs clear. Heart regular in rhythm. Ankles without edema.       Assessment & Plan:  Impression #1 type 2 diabetes suboptimum but with a late steroids and recent sickness likely contributing. Discussed. #2 hypertension good control discussed #3 morbid obesity ongoing. #4 reactive airways substantially improved plan diet exercise discussed maintain same medications. Recheck in 3 months.  Blood work before then if at all possible. WSL

## 2014-05-15 ENCOUNTER — Telehealth: Payer: Self-pay | Admitting: Family Medicine

## 2014-05-15 ENCOUNTER — Encounter: Payer: Self-pay | Admitting: Family Medicine

## 2014-05-15 MED ORDER — INSULIN GLARGINE 100 UNIT/ML ~~LOC~~ SOLN: SUBCUTANEOUS | Status: DC

## 2014-05-15 NOTE — Telephone Encounter (Signed)
Pt states no matter what she does her insurance is only going to pay  20% of her medication for her lantus or vials (which ever way she would chose).  She is at her witts end with all this at this point an wants to know what you recommend. She is at her limit with her pills, she has to be on the shot.   Please advise, pt is running out of insulin at this point  Cost is $652 a month minimum   [facility] [place]

## 2014-05-15 NOTE — Telephone Encounter (Signed)
Patient states that her insurance only covers 20% of her medications cost. She states that a 25 day supply of the Lantus pens will cost her $652.52 and a 30 day supply of the Lantus vials will cost her $864.00. Patient states that she absolutely cannot afford either one of those. She wants to know if there is any other insulin that can be sent in for her that is more affordable. She has to take insulin in order to keep her diabetes under control. Please advise.

## 2014-05-15 NOTE — Telephone Encounter (Signed)
ntsw please hear pt out, then clarify her question, obviously frustrated and understandably

## 2014-05-16 MED ORDER — INSULIN NPH (HUMAN) (ISOPHANE) 100 UNIT/ML ~~LOC~~ SUSP: SUBCUTANEOUS | Status: DC

## 2014-05-16 NOTE — Telephone Encounter (Signed)
Clarify exact dose of lantus, if it is 60 twice per day,  Change to nph insulin twice per day--it will still be costly but not as much--you can let pt know i am very sorry about the hi cost and i think it is criminal what all the drug companies charge for insulin. She also has to take a very hi dose to control sugar.  Any rate change to nph 50 units at b fast and 25 units at supper, stay on this dose for a wk and call us with numbers a wk later  Also, with ins dep diabetes needs to consider getting different insur even if it means changing jobs

## 2014-05-16 NOTE — Telephone Encounter (Signed)
Ps we will almost certainly need to raise pts nph as the weeks go by, but good starting spot.

## 2014-05-16 NOTE — Telephone Encounter (Signed)
Patient states her dose of Lantus is 60 units BID. Notified patient nph insulin twice per day--it will still be costly but not as much--we are very sorry about the hi cost and  think it is criminal what all the drug companies charge for insulin. She also has to take a very hi dose to control sugar.  Any rate change to nph 50 units at b fast and 25 units at supper, stay on this dose for a wk and call us with numbers a week later  Also, with insulin dependent diabetes needs to consider getting different insurance even if it means changing jobs. Patient verbalized understanding. Med sent to [facility].

## 2014-05-17 ENCOUNTER — Telehealth: Payer: Self-pay | Admitting: Family Medicine

## 2014-05-17 NOTE — Telephone Encounter (Signed)
Medication sent to pharmacy yesterday and pharmacy states they did receive it. Patient was notified.

## 2014-05-17 NOTE — Telephone Encounter (Signed)
insulin NPH Human (HUMULIN N,NOVOLIN N) 100 UNIT/ML injection  Pt states [facility] did not get this when sent yesterday, can we resend it please

## 2014-05-25 ENCOUNTER — Telehealth: Payer: Self-pay | Admitting: Family Medicine

## 2014-05-25 MED ORDER — INSULIN NPH (HUMAN) (ISOPHANE) 100 UNIT/ML ~~LOC~~ SUSP: SUBCUTANEOUS | Status: DC

## 2014-05-25 NOTE — Telephone Encounter (Signed)
Pt is wanting the Dr to personally call her so that she can explain to him what is  Going on. Pt states that she feels like crap with her sugars being a high as they are And she feels like she can explain all this to him over the phone before the appt.

## 2014-05-25 NOTE — Telephone Encounter (Signed)
I talked to her, but she is demanding to speak with you.

## 2014-05-25 NOTE — Telephone Encounter (Signed)
I changed instructions in EPIC to 60 am and 35 pm. Pt notified and verbalized understanding. Transferred up front to schedule OV with Dr. Richardson Landry mid next week.

## 2014-05-25 NOTE — Telephone Encounter (Signed)
Call pt. nph definitely not a three time per day insulin and that can cause overlap and dangerous low sugar spells. Go back to twice per day as directed.   Take two thirds in morn and one third in evening 60eachmoren 35 each evening. OV next wk with me mid wk. Bring in glu results then

## 2014-05-25 NOTE — Telephone Encounter (Signed)
3/26 took 50 units in the am 25 units in the pm  Reading 3/27 was 99, followed same dosage Reading 3/28 was 279, followed same dosage (felt bad all day) Reading 3/29 was 275, followed same dosage (took a lunch reading before eating 257, dinner 193, felt bad all day)  Took 25 more units at lunch on this day Reading 3/30 was 151, took 35 units am, lunch an, pm (bed time reading was 119)  Reading 3/31 was 131, took 35 units am, lunch an, pm (bed time reading was 179)  Reading 4/1 was 153, took 40 units this am, intends to take 35 Unit and 40 today  Felt better yesterday an today than she has felt at all before   Call back please

## 2014-05-30 ENCOUNTER — Ambulatory Visit (INDEPENDENT_AMBULATORY_CARE_PROVIDER_SITE_OTHER): Payer: PRIVATE HEALTH INSURANCE | Admitting: Family Medicine

## 2014-05-30 ENCOUNTER — Encounter: Payer: Self-pay | Admitting: Family Medicine

## 2014-05-30 VITALS — BP 148/88 | Ht 63.0 in | Wt 297.8 lb

## 2014-05-30 DIAGNOSIS — E119 Type 2 diabetes mellitus without complications: Secondary | ICD-10-CM | POA: Diagnosis not present

## 2014-05-30 NOTE — Progress Notes (Signed)
   Subjective:    Patient ID: Alicia Horne, female    DOB: February 23, 1966, 49 y.o.   MRN: [id number]  Diabetes She presents for her follow-up diabetic visit. She has type 2 diabetes mellitus. She is compliant with treatment all of the time. (Glucose range from 126 -198)    A1C on 04/13/14 was 7.6.  NPH morning sixty and thirty five at night  Seventy in the morn and forty in the eve pt wants to change back to the lantus insulin. Pt got assistance in helping pay for the lantus. Pt states they are sending her paper work to have doctor filled out to help with cost of med.   [phone],  Total dose of lantus 120 in the past  60 bid        Review of Systems    no headache no chest pain no shortness of breath Objective:   Physical Exam Alert vital stable no acute distress. Lungs clear. Heart regular rhythm. Ankles without edema.       Assessment & Plan:  Impression type 2 diabetes. Patient needs insulin and unfortunately her insurance does not cover the cost at all. She was switched to NPH. She inappropriately began to use it 3 times per day. This required both nurse and intervention and myself speaking with the patient last week over the phone to immediately stop that practice. Rationale was discussed at great length. At that time and today also. Patient continues to experience elevated sugars 160-180. She has found a company that will provide her Lantus at reduce cost but she has to ship it here. I notified her that our policy is not to act as a go-between with medications, this creates much to many challenges where we basically have to act like a pharmacy. Patient would like to have endocrinology take over her management and this is certainly reasonable. We will work on the referral. 25 minutes spent both in discussion and interaction with the patient in the prior week WSL

## 2014-06-26 ENCOUNTER — Ambulatory Visit (INDEPENDENT_AMBULATORY_CARE_PROVIDER_SITE_OTHER): Payer: Self-pay | Admitting: Internal Medicine

## 2014-06-26 ENCOUNTER — Encounter: Payer: Self-pay | Admitting: Internal Medicine

## 2014-06-26 VITALS — BP 138/80 | HR 72 | Temp 98.0°F | Resp 14 | Ht 63.0 in | Wt 290.0 lb

## 2014-06-26 DIAGNOSIS — E039 Hypothyroidism, unspecified: Secondary | ICD-10-CM

## 2014-06-26 DIAGNOSIS — E119 Type 2 diabetes mellitus without complications: Secondary | ICD-10-CM

## 2014-06-26 MED ORDER — INSULIN GLARGINE 300 UNIT/ML ~~LOC~~ SOPN: 50.0000 [IU] | PEN_INJECTOR | Freq: Two times a day (BID) | SUBCUTANEOUS | Status: DC

## 2014-06-26 MED ORDER — CANAGLIFLOZIN 100 MG PO TABS: 100.0000 mg | ORAL_TABLET | Freq: Every day | ORAL | Status: DC

## 2014-06-26 MED ORDER — INSULIN PEN NEEDLE 32G X 4 MM MISC: Status: DC

## 2014-06-26 NOTE — [facility] Notes (Signed)
Patient ID: Alicia Horne, female   DOB: 07/27/1965, 49 y.o.   MRN: [id number]  HPI: Alicia Horne is a 49 y.o.-year-old female, self- referred, for management of DM2, dx in 2001 (GDM 1998), insulin-dependent since 2015, uncontrolled, without complications. Insurance: [employer], Rx insurance: [employer].   Last hemoglobin A1c was: Lab Results  Component Value Date   HGBA1C 7.6 04/13/2014   HGBA1C 7.3 01/04/2014   HGBA1C 8.0 10/04/2013   Pt is on a regimen of: - Metformin 1500 -1000 mg (better tolerated after cholecystectomy 2013) - Amaryl 4 - 1 tab in am and 1/2 tab at bedtime - NPH 70 units in am and 40 units at bedtime Previously on Lantus (insurance coverage) 60 units bid, she felt better on this. She can get Lantus with 60$ a mo.  She tried Victoza >> nausea.  Pt checks her sugars 1x a day and they are: - am: on Lantus 120-130; NPH: 80-85, occasionally 116, 120 - 2h after b'fast: n/c - before lunch: n/c - 2h after lunch: 180-212 - before dinner: n/c - 2h after dinner: n/c - bedtime: n/c - nighttime: n/c No lows. Lowest sugar was 80; she has hypoglycemia awareness at 80.  Highest sugar was 350.  Glucometer: ReliOn  Pt's meals are: - Breakfast: eggs + toast or yoghurt (may skip) - Lunch: sandwich + chips - Dinner: meat + 2 veg - Snacks: 2  - no CKD, last BUN/creatinine:  Lab Results  Component Value Date   BUN 14 03/28/2013   CREATININE 0.84 03/28/2013  On Lisinopril. - last set of lipids: Lab Results  Component Value Date   CHOL 191 12/30/2013   HDL 42 12/30/2013   LDLCALC 127* 12/30/2013   TRIG 111 12/30/2013   CHOLHDL 4.5 12/30/2013  On Pravastatin. - last eye exam was in 04/2014. No DR.  - + numbness and tingling in her feet.  Pt has FH of DM in father (DM2), half-brother with DM1.  She had a partial nephrectomy for renal cancer. She also has HTN, HL, fatty liver, hypothyroidism, h/o anemia.  ROS: Constitutional: + weight gain (weight loss per our  scale), fatigue, + subjective hyperthermia, + poor sleep Eyes: no blurry vision, no xerophthalmia ENT: no sore throat, no nodules palpated in throat, no dysphagia/odynophagia, no hoarseness Cardiovascular: no CP/SOB/palpitations/+ leg swelling Respiratory: no cough/SOB Gastrointestinal: no N/V/+ D/+ C Musculoskeletal: no muscle/joint aches Skin: no rashes, + easy bruising Neurological: no tremors/numbness/tingling/dizziness Psychiatric: no depression/anxiety  Past Medical History  Diagnosis Date  . Anemia   . Diabetes mellitus   . Hyperlipidemia   . Hypertension   . Back pain, chronic   . Gallstones   . Renal mass, left   . Hypothyroidism   . Morbid obesity   . Renal cell cancer    Past Surgical History  Procedure Laterality Date  . Cesarean section  1992 and 1998  . Tubal ligation  1998  . Ectopic pregnancy surgery  1994  . Cholecystectomy  07/03/2011    Procedure: LAPAROSCOPIC CHOLECYSTECTOMY WITH INTRAOPERATIVE CHOLANGIOGRAM;  Surgeon: Odis Hollingshead, MD;  Location: [facility];  Service: General;  Laterality: N/A;   History   Social History  . Marital Status: Married    Spouse Name: N/A  . Number of Children: 2   Occupational History  . A/R clerk   Social History Main Topics  . Smoking status: Never Smoker   . Smokeless tobacco: Not on file  . Alcohol Use: No  . Drug Use: No  Current Outpatient Prescriptions on File Prior to Visit  Medication Sig Dispense Refill  . albuterol (PROVENTIL HFA;VENTOLIN HFA) 108 (90 BASE) MCG/ACT inhaler Inhale 2 puffs into the lungs every 6 (six) hours as needed for wheezing or shortness of breath. 1 Inhaler 0  . glimepiride (AMARYL) 4 MG tablet TAKE 1 tablet qam and 1/2 TABLETS BY MOUTH IN THE EVENING. 135 tablet 1  . insulin NPH Human (HUMULIN N,NOVOLIN N) 100 UNIT/ML injection Take 60 units at breakfast and 35 units at supper. 10 mL 0  . levothyroxine (SYNTHROID, LEVOTHROID) 137 MCG tablet Take 1 tablet (137 mcg total) by mouth  daily before breakfast. 90 tablet 1  . lidocaine (LIDODERM) 5 % Place 1 patch onto the skin daily. Remove & Discard patch within 12 hours or as directed by MD    . lisinopril-hydrochlorothiazide (PRINZIDE,ZESTORETIC) 20-12.5 MG per tablet Take 1 tablet by mouth daily with breakfast. 90 tablet 1  . metFORMIN (GLUCOPHAGE) 1000 MG tablet One tablet qam , one half tablet at noon and one tablet qpm (Patient taking differently: One tablet and one half tablet in the morning and one tablet qpm) 225 tablet 1  . Multiple Vitamins-Minerals (MULTIVITAMIN WITH MINERALS) tablet Take 1 tablet by mouth daily.    . pravastatin (PRAVACHOL) 80 MG tablet Take 1 tablet (80 mg total) by mouth daily. 90 tablet 1  . oxyCODONE (OXYCONTIN) 10 MG 12 hr tablet Take 10 mg by mouth every 12 (twelve) hours as needed for pain.     No current facility-administered medications on file prior to visit.   Allergies  Allergen Reactions  . Victoza [Liraglutide]   . Tape Other (See Comments)    Makes her skin burn   Family History  Problem Relation Age of Onset  . Hypertension Mother   . Hypertension Father   . Diabetes Father    PE: BP 138/80 mmHg  Pulse 72  Temp(Src) 98 F (36.7 C) (Oral)  Resp 14  Ht 5\' 3"  (1.6 m)  Wt 290 lb (131.543 kg)  BMI 51.38 kg/m2  SpO2 95% Wt Readings from Last 3 Encounters:  06/26/14 290 lb (131.543 kg)  05/30/14 297 lb 12.8 oz (135.081 kg)  04/13/14 302 lb (136.986 kg)   Constitutional: obese, in NAD Eyes: PERRLA, EOMI, no exophthalmos ENT: moist mucous membranes, no thyromegaly, no cervical lymphadenopathy Cardiovascular: RRR, No MRG, + nonpitting periankle edema Respiratory: CTA B Gastrointestinal: abdomen soft, NT, ND, BS+ Musculoskeletal: no deformities, strength intact in all 4 Skin: moist, warm, no rashes Neurological: no tremor with outstretched hands, DTR normal in all 4  ASSESSMENT: 1. DM2, insulin-dependent, uncontrolled, without complications - ? PN  2.  Hypothyroidism  PLAN:  1. Patient with long-standing, uncontrolled diabetes, on oral antidiabetic regimen + large dose of basal intermediate acting insulin, which became insufficient. She has hypoglycemia in a.m. and hyperglycemia midday. She is not checking sugars around dinnertime or bedtime. She was doing better on Lantus compared to her current basal insulin, NPH. Since she is using high doses of insulin, I suggested that she tries Toujeo. As she needs to lose weight, we need to try to decrease insulin and Amaryl, if possible, therefore, will start Invokana.  - We discussed about options for treatment, and I suggested to:  Patient Instructions  Please continue: - Metformin 1500 mg in am and 1000 mg with dinner - Amaryl 4 mg in am and 2 mg in pm (move this before the meals)  Please stop NPH and start Toujeo 50  units 2x a day.  Start Invokana 100 mg in am before breakfast.   Please return in 2 months with your sugar log. However, if there is a cancellation then let me see you back at beginning of June.  Please come back for labs in June.  - we discussed about SEs of Invokana, which are: dizziness (advised to be careful when stands from sitting position), decreased BP - usually not < normal (BP today is not low), and fungal UTIs (advised to let me know if develops one).  - given discount card for Invokana - given discount card for Toujeo - Strongly advised her to start checking sugars at different times of the day - check 2-3 times a day, rotating checks - given sugar log and advised how to fill it and to bring it at next appt  - given foot care handout and explained the principles  - given instructions for hypoglycemia management "15-15 rule"  - advised for yearly eye exams - Return to clinic in 1 mo if available slot, if not, only come for labs in 1 mo and for a visit in 2 mo with sugar log. In this case, she will bring me the sugar log in 1 mo.  2. Hypothyroidism - on LT4 137 mcg  daily - prev labs normal in 12/2013 Lab Results  Component Value Date   TSH 2.293 12/30/2013  - check TSH in 1 mo  - time spent with the patient: 1 hour, of which >50% was spent in obtaining information about her diabetes reviewing her previous labs, evaluations, and treatments, counseling her about her condition (please see the discussed topics above), and developing a plan for treatment; she had a number of questions which I addressed.

## 2014-06-26 NOTE — Patient Instructions (Signed)
Please continue: - Metformin 1500 mg in am and 1000 mg with dinner - Amaryl 4 mg in am and 2 mg in pm (move this before the meals)  Please stop NPH and start Toujeo 50 units 2x a day.  Start Invokana 100 mg in am before breakfast.   Please return in 2 months with your sugar log. However, if there is a cancellation then let me see you back at beginning of June.  Please come back for labs in June.

## 2014-07-06 ENCOUNTER — Other Ambulatory Visit: Payer: Self-pay | Admitting: *Deleted

## 2014-07-06 ENCOUNTER — Telehealth: Payer: Self-pay | Admitting: Internal Medicine

## 2014-07-06 MED ORDER — INSULIN GLARGINE 300 UNIT/ML ~~LOC~~ SOPN: 50.0000 [IU] | PEN_INJECTOR | Freq: Two times a day (BID) | SUBCUTANEOUS | Status: DC

## 2014-07-06 MED ORDER — CANAGLIFLOZIN 100 MG PO TABS: 100.0000 mg | ORAL_TABLET | Freq: Every day | ORAL | Status: DC

## 2014-07-06 NOTE — Telephone Encounter (Signed)
Did we receive the med pt assistance forms for this pt....what is the status

## 2014-07-06 NOTE — Telephone Encounter (Signed)
Called pt and advised her that the forms along with rx for meds was faxed to [facility] this afternoon. Pt voiced understanding.

## 2014-07-10 ENCOUNTER — Encounter: Payer: Self-pay | Admitting: Family Medicine

## 2014-07-11 ENCOUNTER — Ambulatory Visit: Payer: PRIVATE HEALTH INSURANCE | Admitting: Family Medicine

## 2014-08-15 ENCOUNTER — Encounter: Payer: Self-pay | Admitting: Internal Medicine

## 2014-08-16 ENCOUNTER — Other Ambulatory Visit: Payer: Self-pay

## 2014-08-20 ENCOUNTER — Other Ambulatory Visit: Payer: Self-pay

## 2014-08-30 ENCOUNTER — Other Ambulatory Visit (INDEPENDENT_AMBULATORY_CARE_PROVIDER_SITE_OTHER): Payer: No Typology Code available for payment source

## 2014-08-30 DIAGNOSIS — E039 Hypothyroidism, unspecified: Secondary | ICD-10-CM

## 2014-08-30 DIAGNOSIS — E119 Type 2 diabetes mellitus without complications: Secondary | ICD-10-CM

## 2014-08-30 LAB — COMPLETE METABOLIC PANEL WITH GFR
ALBUMIN: 3.9 g/dL (ref 3.5–5.2)
ALT: 64 U/L — AB (ref 0–35)
AST: 39 U/L — ABNORMAL HIGH (ref 0–37)
Alkaline Phosphatase: 64 U/L (ref 39–117)
BUN: 20 mg/dL (ref 6–23)
CALCIUM: 9.3 mg/dL (ref 8.4–10.5)
CO2: 21 meq/L (ref 19–32)
Chloride: 103 mEq/L (ref 96–112)
Creat: 0.98 mg/dL (ref 0.50–1.10)
GFR, EST NON AFRICAN AMERICAN: 68 mL/min
GFR, Est African American: 79 mL/min
GLUCOSE: 134 mg/dL — AB (ref 70–99)
POTASSIUM: 4.3 meq/L (ref 3.5–5.3)
SODIUM: 137 meq/L (ref 135–145)
TOTAL PROTEIN: 7 g/dL (ref 6.0–8.3)
Total Bilirubin: 0.3 mg/dL (ref 0.2–1.2)

## 2014-08-30 LAB — TSH: TSH: 2.19 u[IU]/mL (ref 0.35–4.50)

## 2014-08-30 LAB — HEMOGLOBIN A1C: Hgb A1c MFr Bld: 7.8 % — ABNORMAL HIGH (ref 4.6–6.5)

## 2014-09-04 ENCOUNTER — Ambulatory Visit (INDEPENDENT_AMBULATORY_CARE_PROVIDER_SITE_OTHER): Payer: No Typology Code available for payment source | Admitting: Internal Medicine

## 2014-09-04 ENCOUNTER — Encounter: Payer: Self-pay | Admitting: Internal Medicine

## 2014-09-04 VITALS — BP 114/62 | HR 78 | Temp 98.2°F | Resp 12 | Wt 291.6 lb

## 2014-09-04 DIAGNOSIS — E1165 Type 2 diabetes mellitus with hyperglycemia: Secondary | ICD-10-CM

## 2014-09-04 DIAGNOSIS — E039 Hypothyroidism, unspecified: Secondary | ICD-10-CM

## 2014-09-04 MED ORDER — GLIMEPIRIDE 4 MG PO TABS: ORAL_TABLET | ORAL | Status: DC

## 2014-09-04 MED ORDER — LEVOTHYROXINE SODIUM 137 MCG PO TABS: 137.0000 ug | ORAL_TABLET | Freq: Every day | ORAL | Status: DC

## 2014-09-04 MED ORDER — METFORMIN HCL 1000 MG PO TABS: ORAL_TABLET | ORAL | Status: DC

## 2014-09-04 NOTE — [facility] Notes (Signed)
Patient ID: Alicia Horne, female   DOB: 04-Sep-1965, 49 y.o.   MRN: [id number]  HPI: Alicia Horne is a 49 y.o.-year-old female, returning for f/u for DM2, dx in 2001 (GDM 1998), insulin-dependent since 2015, uncontrolled, without complications. Last visit 2 mo ago. Insurance: [employer], Rx insurance: [employer].   She is watching what she eats.   Last hemoglobin A1c was: Lab Results  Component Value Date   HGBA1C 7.8* 08/30/2014   HGBA1C 7.6 04/13/2014   HGBA1C 7.3 01/04/2014   Pt is on a regimen of: - Metformin 1500 -1000 mg (better tolerated after cholecystectomy 2013) - Amaryl 4 mg in am and 2 mg before dinner - Toujeo 50 units 2x a day - started 07/28/2014 - Invokana 100 mg in am before breakfast  - started 08/08/2014 We stopped NPH 70 units in am and 40 units at bedtime Previously on Lantus (insurance coverage) 60 units bid, she felt better on this. She can get Lantus with 60$ a mo.  She tried Victoza >> nausea.  Pt checks her sugars 2x a day and they are much better in last month: - am: on Lantus 120-130; NPH: 80-85, occasionally 116, 120 >> 110-133 - 2h after b'fast: n/c >> 124-136 - before lunch: n/c >> 109-132 - 2h after lunch: 180-212 >> 124-136 - before dinner: n/c >> 118-139 - 2h after dinner: n/c >> 141, 156 - bedtime: n/c >> 138-148, 160 - nighttime: n/c No lows. Lowest sugar was 80; she has hypoglycemia awareness at 80.  Highest sugar was 350 >> 170 in last 2 weeks.  Glucometer: ReliOn  Pt's meals are: - Breakfast: eggs + toast or yoghurt (may skip) - Lunch: sandwich + chips - Dinner: meat + 2 veg - Snacks: 2  - no CKD, last BUN/creatinine:  Lab Results  Component Value Date   BUN 20 08/30/2014   CREATININE 0.98 08/30/2014  On Lisinopril. - last set of lipids: Lab Results  Component Value Date   CHOL 191 12/30/2013   HDL 42 12/30/2013   LDLCALC 127* 12/30/2013   TRIG 111 12/30/2013   CHOLHDL 4.5 12/30/2013  On Pravastatin. - last eye exam was  in 04/2014. No DR.  - + numbness and tingling in her feet.  Hypothyroidism: - last tsh: Lab Results  Component Value Date   TSH 2.19 08/30/2014   She takes 137 mcg LT4 daily. - in am - fasting - eats b'fast >30 minmlater - no Ca, Fe, MVI, PPI  She had a partial nephrectomy for renal cancer. She also has HTN, HL, fatty liver, hypothyroidism, h/o anemia.  ROS: Constitutional: no weight gain, no fatigue, no subjective hyperthermia Eyes: no blurry vision, no xerophthalmia ENT: no sore throat, no nodules palpated in throat, no dysphagia/odynophagia, no hoarseness Cardiovascular: no CP/SOB/palpitations/resolved leg swelling Respiratory: no cough/SOB Gastrointestinal: no N/V/D/C Musculoskeletal: no muscle/joint aches Skin: no rashes Neurological: no tremors/numbness/tingling/dizziness  I reviewed pt's medications, allergies, PMH, social hx, family hx, and changes were documented in the history of present illness. Otherwise, unchanged from my initial visit note:  Past Medical History  Diagnosis Date  . Anemia   . Diabetes mellitus   . Hyperlipidemia   . Hypertension   . Back pain, chronic   . Gallstones   . Renal mass, left   . Hypothyroidism   . Morbid obesity   . Renal cell cancer    Past Surgical History  Procedure Laterality Date  . Cesarean section  1992 and 1998  . Tubal ligation  1998  . Ectopic pregnancy surgery  1994  . Cholecystectomy  07/03/2011    Procedure: LAPAROSCOPIC CHOLECYSTECTOMY WITH INTRAOPERATIVE CHOLANGIOGRAM;  Surgeon: Odis Hollingshead, MD;  Location: [facility];  Service: General;  Laterality: N/A;   History   Social History  . Marital Status: Married    Spouse Name: N/A  . Number of Children: 2   Occupational History  . A/R clerk   Social History Main Topics  . Smoking status: Never Smoker   . Smokeless tobacco: Not on file  . Alcohol Use: No  . Drug Use: No   Current Outpatient Prescriptions on File Prior to Visit  Medication Sig  Dispense Refill  . albuterol (PROVENTIL HFA;VENTOLIN HFA) 108 (90 BASE) MCG/ACT inhaler Inhale 2 puffs into the lungs every 6 (six) hours as needed for wheezing or shortness of breath. 1 Inhaler 0  . canagliflozin (INVOKANA) 100 MG TABS tablet Take 1 tablet (100 mg total) by mouth daily. 90 tablet 2  . glimepiride (AMARYL) 4 MG tablet TAKE 1 tablet qam and 1/2 TABLETS BY MOUTH IN THE EVENING. 135 tablet 1  . Insulin Glargine (TOUJEO SOLOSTAR) 300 UNIT/ML SOPN Inject 50 Units into the skin 2 (two) times daily. 18 pen 2  . Insulin Pen Needle (CAREFINE PEN NEEDLES) 32G X 4 MM MISC Use 2x a day 200 each 5  . levothyroxine (SYNTHROID, LEVOTHROID) 137 MCG tablet Take 1 tablet (137 mcg total) by mouth daily before breakfast. 90 tablet 1  . lidocaine (LIDODERM) 5 % Place 1 patch onto the skin daily. Remove & Discard patch within 12 hours or as directed by MD    . lisinopril-hydrochlorothiazide (PRINZIDE,ZESTORETIC) 20-12.5 MG per tablet Take 1 tablet by mouth daily with breakfast. 90 tablet 1  . metFORMIN (GLUCOPHAGE) 1000 MG tablet One tablet qam , one half tablet at noon and one tablet qpm (Patient taking differently: One tablet and one half tablet in the morning and one tablet qpm) 225 tablet 1  . Multiple Vitamins-Minerals (MULTIVITAMIN WITH MINERALS) tablet Take 1 tablet by mouth daily.    Marland Kitchen oxyCODONE (OXYCONTIN) 10 MG 12 hr tablet Take 10 mg by mouth every 12 (twelve) hours as needed for pain.    . pravastatin (PRAVACHOL) 80 MG tablet Take 1 tablet (80 mg total) by mouth daily. 90 tablet 1  . fluconazole (DIFLUCAN) 150 MG tablet Take 150 mg by mouth daily.     No current facility-administered medications on file prior to visit.   Allergies  Allergen Reactions  . Victoza [Liraglutide]   . Tape Other (See Comments)    Makes her skin burn   Family History  Problem Relation Age of Onset  . Hypertension Mother   . Hypertension Father   . Diabetes Father    PE: BP 114/62 mmHg  Pulse 78   Temp(Src) 98.2 F (36.8 C) (Oral)  Resp 12  Wt 291 lb 9.6 oz (132.269 kg)  SpO2 98% Wt Readings from Last 3 Encounters:  09/04/14 291 lb 9.6 oz (132.269 kg)  06/26/14 290 lb (131.543 kg)  05/30/14 297 lb 12.8 oz (135.081 kg)   Constitutional: obese, in NAD Eyes: PERRLA, EOMI, no exophthalmos ENT: moist mucous membranes, no thyromegaly, no cervical lymphadenopathy Cardiovascular: RRR, No MRG, + nonpitting periankle edema Respiratory: CTA B Gastrointestinal: abdomen soft, NT, ND, BS+ Musculoskeletal: no deformities, strength intact in all 4 Skin: moist, warm, no rashes Neurological: no tremor with outstretched hands, DTR normal in all 4  ASSESSMENT: 1. DM2, insulin-dependent, uncontrolled, without  complications - ? PN  2. Hypothyroidism  PLAN:  1. Patient with long-standing, uncontrolled diabetes, on oral antidiabetic regimen + basal insulin, now with much better control in last month after starting Toujeo and Invokana! She tolerates these well and her leg swelling has improved on Invokana. We checked GFR 08/30/2014 >> normal GFR and potassium. - I suggested to:  Patient Instructions  Please continue: - Metformin 1500 mg in am and  1000 mg in pm - Amaryl 4 mg in am and 2 mg before dinner - Toujeo 50 units 2x a day  - Invokana 100 mg in am before breakfast   Keep up the great job!  Please come back for a follow-up appointment in 3 months.  - no SEs from Invokana - continue checking sugars at different times of the day - check 2-3 times a day, rotating checks - UTD with yearly eye exams - refilled DM meds - Return to clinic in 3 mo  2. Hypothyroidism - on LT4 137 mcg daily, taken correctly - TFTs normal at last check this month - refilled LT4

## 2014-09-04 NOTE — Patient Instructions (Signed)
Please continue: - Metformin 1500 mg in am and  1000 mg in pm - Amaryl 4 mg in am and 2 mg before dinner - Toujeo 50 units 2x a day  - Invokana 100 mg in am before breakfast   Keep up the great job!  Please come back for a follow-up appointment in 3 months.

## 2014-09-30 ENCOUNTER — Other Ambulatory Visit: Payer: Self-pay | Admitting: Family Medicine

## 2014-10-08 ENCOUNTER — Telehealth: Payer: Self-pay | Admitting: Internal Medicine

## 2014-10-08 NOTE — Telephone Encounter (Signed)
Patient would like to know if we received her medication toujeo, please advise

## 2014-10-08 NOTE — Telephone Encounter (Signed)
Called pt and advised her that she can pick up her Toujeo (from [facility]), patient asst program. Pt voiced understanding.

## 2014-11-05 ENCOUNTER — Other Ambulatory Visit: Payer: Self-pay | Admitting: Internal Medicine

## 2014-11-05 ENCOUNTER — Telehealth: Payer: Self-pay | Admitting: Internal Medicine

## 2014-11-05 ENCOUNTER — Other Ambulatory Visit: Payer: Self-pay

## 2014-11-05 MED ORDER — CANAGLIFLOZIN 100 MG PO TABS: 100.0000 mg | ORAL_TABLET | Freq: Every day | ORAL | Status: DC

## 2014-11-05 NOTE — Telephone Encounter (Signed)
Pt needs refill on invokana °

## 2014-11-06 NOTE — Telephone Encounter (Signed)
Patient stated the the pharmacy haven't  received her refill for canagliflozin (INVOKANA) 100 MG TABS tablet, send to [facility] in [place]

## 2014-12-05 ENCOUNTER — Other Ambulatory Visit (INDEPENDENT_AMBULATORY_CARE_PROVIDER_SITE_OTHER): Payer: No Typology Code available for payment source | Admitting: *Deleted

## 2014-12-05 ENCOUNTER — Encounter: Payer: Self-pay | Admitting: Internal Medicine

## 2014-12-05 ENCOUNTER — Ambulatory Visit (INDEPENDENT_AMBULATORY_CARE_PROVIDER_SITE_OTHER): Payer: No Typology Code available for payment source | Admitting: Internal Medicine

## 2014-12-05 VITALS — BP 114/60 | HR 76 | Temp 98.2°F | Resp 12 | Wt 284.4 lb

## 2014-12-05 DIAGNOSIS — Z23 Encounter for immunization: Secondary | ICD-10-CM | POA: Diagnosis not present

## 2014-12-05 DIAGNOSIS — Z794 Long term (current) use of insulin: Secondary | ICD-10-CM | POA: Diagnosis not present

## 2014-12-05 DIAGNOSIS — E039 Hypothyroidism, unspecified: Secondary | ICD-10-CM

## 2014-12-05 DIAGNOSIS — E1165 Type 2 diabetes mellitus with hyperglycemia: Secondary | ICD-10-CM | POA: Diagnosis not present

## 2014-12-05 LAB — POCT GLYCOSYLATED HEMOGLOBIN (HGB A1C): Hemoglobin A1C: 7.1

## 2014-12-05 NOTE — Patient Instructions (Signed)
Please continue: - Amaryl 4 mg in am and 2 mg before dinner - Invokana 100 mg in am before breakfast   Please decrease: - Metformin to 1000 mg in am and 1000 mg in pm - Toujeo to 50 unitsin am and 40 units at bedtime  Keep up the great job!  Please come back for a follow-up appointment in 3 months.

## 2014-12-05 NOTE — [facility] Notes (Signed)
Patient ID: BLU MCGLAUN, female   DOB: 1965/12/03, 49 y.o.   MRN: [id number]  HPI: Alicia Horne is a 49 y.o.-year-old female, returning for f/u for DM2, dx in 2001 (GDM 1998), insulin-dependent since 2015, uncontrolled, without complications. Last visit 3 mo ago. New PCP: Velna Hatchet ([facility]). Insurance: [employer], Rx insurance: [employer].   Last hemoglobin A1c was: Lab Results  Component Value Date   HGBA1C 7.8* 08/30/2014   HGBA1C 7.6 04/13/2014   HGBA1C 7.3 01/04/2014   Pt is on a regimen of: - Metformin 1500 -1000 mg (better tolerated after cholecystectomy 2013) - Amaryl 4 mg in am and 2 mg before dinner - Toujeo 50 units 2x a day - started 07/28/2014 - Invokana 100 mg in am before breakfast  - started 08/08/2014 We stopped NPH 70 units in am and 40 units at bedtime Previously on Lantus (insurance coverage) 60 units bid, she felt better on this. She can get Lantus with 60$ a mo.  She tried Victoza >> nausea.  Pt checks her sugars 2x a day and they are much better (per her recall) - am: on Lantus 120-130; NPH: 80-85, occasionally 116, 120 >> 110-133 >> 84-115 - 2h after b'fast: n/c >> 124-136 >> n/c - before lunch: n/c >> 109-132 >> n/c - 2h after lunch: 180-212 >> 124-136 >> 120-130s - before dinner: n/c >> 118-139 >> 125 - 2h after dinner: n/c >> 141, 156 >> n/c - bedtime: n/c >> 138-148, 160 >> 145, 160 - nighttime: n/c No lows. Lowest sugar was 80's; she has hypoglycemia awareness at 80.  Highest sugar was 350 >> 170 >> 160.  Glucometer: ReliOn  Pt's meals are: - Breakfast: eggs + toast or yoghurt (may skip) - Lunch: sandwich + chips - Dinner: meat + 2 veg - Snacks: 2  - no CKD, last BUN/creatinine:  Lab Results  Component Value Date   BUN 20 08/30/2014   CREATININE 0.98 08/30/2014  On Lisinopril. - last set of lipids: Lab Results  Component Value Date   CHOL 191 12/30/2013   HDL 42 12/30/2013   LDLCALC 127* 12/30/2013   TRIG 111 12/30/2013   CHOLHDL  4.5 12/30/2013  On Pravastatin. - last eye exam was in 02/23/2014. No DR.  - + numbness and tingling in her feet.  Hypothyroidism: - last tsh: Lab Results  Component Value Date   TSH 2.19 08/30/2014   She takes 137 mcg LT4 daily. - in am - fasting - eats b'fast >30 min later - no Ca, Fe, MVI, PPI  She had a partial nephrectomy for renal cancer. She also has HTN, HL, fatty liver, hypothyroidism, h/o anemia.  ROS: Constitutional: no weight gain, no fatigue, no subjective hyperthermia Eyes: no blurry vision, no xerophthalmia ENT: no sore throat, no nodules palpated in throat, no dysphagia/odynophagia, no hoarseness Cardiovascular: no CP/SOB/palpitations/resolved leg swelling Respiratory: no cough/SOB Gastrointestinal: no N/V/D/C Musculoskeletal: no muscle/joint aches Skin: no rashes Neurological: no tremors/numbness/tingling/dizziness  I reviewed pt's medications, allergies, PMH, social hx, family hx, and changes were documented in the history of present illness. Otherwise, unchanged from my initial visit note:  Past Medical History  Diagnosis Date  . Anemia   . Diabetes mellitus   . Hyperlipidemia   . Hypertension   . Back pain, chronic   . Gallstones   . Renal mass, left   . Hypothyroidism   . Morbid obesity   . Renal cell cancer    Past Surgical History  Procedure Laterality Date  .  Cesarean section  1992 and 1998  . Tubal ligation  1998  . Ectopic pregnancy surgery  1994  . Cholecystectomy  07/03/2011    Procedure: LAPAROSCOPIC CHOLECYSTECTOMY WITH INTRAOPERATIVE CHOLANGIOGRAM;  Surgeon: Odis Hollingshead, MD;  Location: [facility];  Service: General;  Laterality: N/A;   History   Social History  . Marital Status: Married    Spouse Name: N/A  . Number of Children: 2   Occupational History  . A/R clerk   Social History Main Topics  . Smoking status: Never Smoker   . Smokeless tobacco: Not on file  . Alcohol Use: No  . Drug Use: No   Current Outpatient  Prescriptions on File Prior to Visit  Medication Sig Dispense Refill  . albuterol (PROVENTIL HFA;VENTOLIN HFA) 108 (90 BASE) MCG/ACT inhaler Inhale 2 puffs into the lungs every 6 (six) hours as needed for wheezing or shortness of breath. 1 Inhaler 0  . canagliflozin (INVOKANA) 100 MG TABS tablet Take 1 tablet (100 mg total) by mouth daily. 90 tablet 2  . fluconazole (DIFLUCAN) 150 MG tablet Take 150 mg by mouth daily.    Marland Kitchen glimepiride (AMARYL) 4 MG tablet TAKE 1 tablet qam and 1/2 TABLETS BY MOUTH IN THE EVENING. 135 tablet 1  . Insulin Glargine (TOUJEO SOLOSTAR) 300 UNIT/ML SOPN Inject 50 Units into the skin 2 (two) times daily. 18 pen 2  . Insulin Pen Needle (CAREFINE PEN NEEDLES) 32G X 4 MM MISC Use 2x a day 200 each 5  . INVOKANA 100 MG TABS tablet TAKE ONE TABLET BY MOUTH ONCE DAILY 30 tablet 2  . levothyroxine (SYNTHROID, LEVOTHROID) 137 MCG tablet Take 1 tablet (137 mcg total) by mouth daily before breakfast. 90 tablet 1  . lidocaine (LIDODERM) 5 % Place 1 patch onto the skin daily. Remove & Discard patch within 12 hours or as directed by MD    . lisinopril-hydrochlorothiazide (PRINZIDE,ZESTORETIC) 20-12.5 MG per tablet TAKE ONE TABLET BY MOUTH ONCE DAILY WITH BREAKFAST 90 tablet 0  . metFORMIN (GLUCOPHAGE) 1000 MG tablet One tablet qam , one half tablet at noon and one tablet qpm 225 tablet 1  . Multiple Vitamins-Minerals (MULTIVITAMIN WITH MINERALS) tablet Take 1 tablet by mouth daily.    Marland Kitchen oxyCODONE (OXYCONTIN) 10 MG 12 hr tablet Take 10 mg by mouth every 12 (twelve) hours as needed for pain.    . pravastatin (PRAVACHOL) 80 MG tablet Take 1 tablet (80 mg total) by mouth daily. 90 tablet 1   No current facility-administered medications on file prior to visit.   Allergies  Allergen Reactions  . Victoza [Liraglutide]   . Tape Other (See Comments)    Makes her skin burn   Family History  Problem Relation Age of Onset  . Hypertension Mother   . Hypertension Father   . Diabetes  Father    PE: BP 114/60 mmHg  Pulse 76  Temp(Src) 98.2 F (36.8 C) (Oral)  Resp 12  Wt 284 lb 6.4 oz (129.003 kg)  SpO2 97% Body mass index is 50.39 kg/(m^2). Wt Readings from Last 3 Encounters:  12/05/14 284 lb 6.4 oz (129.003 kg)  09/04/14 291 lb 9.6 oz (132.269 kg)  06/26/14 290 lb (131.543 kg)   Constitutional: obese, in NAD Eyes: PERRLA, EOMI, no exophthalmos ENT: moist mucous membranes, no thyromegaly, no cervical lymphadenopathy Cardiovascular: RRR, No MRG, + nonpitting periankle edema Respiratory: CTA B Gastrointestinal: abdomen soft, NT, ND, BS+ Musculoskeletal: no deformities, strength intact in all 4 Skin: moist, warm, no  rashes Neurological: no tremor with outstretched hands, DTR normal in all 4  ASSESSMENT: 1. DM2, insulin-dependent, uncontrolled, without complications - ? PN  2. Hypothyroidism  PLAN:  1. Patient with long-standing, uncontrolled diabetes, on oral antidiabetic regimen + basal insulin, now with much better control. Will try to reduce her dose of Toujeo and also simplify her Metformin regimen. - I suggested to:  Patient Instructions  Please continue: - Amaryl 4 mg in am and 2 mg before dinner - Invokana 100 mg in am before breakfast   Please decrease: - Metformin to 1000 mg in am and 1000 mg in pm - Toujeo to 50 unitsin am and 40 units at bedtime  Keep up the great job!  Please come back for a follow-up appointment in 3 months.  - no SEs from Invokana - continue checking sugars at different times of the day - check 2-3 times a day, rotating checks - UTD with yearly eye exams - will give her a flu shot today - Return to clinic in 3 mo  2. Hypothyroidism - on LT4 137 mcg daily, taken correctly - TFTs normal at last check 3 month ago

## 2014-12-06 ENCOUNTER — Telehealth: Payer: Self-pay | Admitting: *Deleted

## 2014-12-06 NOTE — Telephone Encounter (Signed)
Called pt and lvm advising her that her Patient Asst for the Toujeo is here for her to pick up at her convenience.

## 2014-12-13 ENCOUNTER — Other Ambulatory Visit: Payer: Self-pay | Admitting: *Deleted

## 2014-12-13 ENCOUNTER — Encounter: Payer: Self-pay | Admitting: Internal Medicine

## 2014-12-13 MED ORDER — FLUCONAZOLE 150 MG PO TABS: 150.0000 mg | ORAL_TABLET | Freq: Once | ORAL | Status: DC

## 2014-12-13 NOTE — Telephone Encounter (Signed)
Sent per request of Dr Cruzita Lederer.

## 2015-02-05 ENCOUNTER — Other Ambulatory Visit: Payer: Self-pay | Admitting: Internal Medicine

## 2015-03-07 ENCOUNTER — Encounter: Payer: Self-pay | Admitting: Internal Medicine

## 2015-03-07 ENCOUNTER — Ambulatory Visit (INDEPENDENT_AMBULATORY_CARE_PROVIDER_SITE_OTHER): Payer: BLUE CROSS/BLUE SHIELD | Admitting: Internal Medicine

## 2015-03-07 VITALS — BP 108/62 | HR 82 | Temp 98.4°F | Resp 12 | Wt 284.6 lb

## 2015-03-07 DIAGNOSIS — E1165 Type 2 diabetes mellitus with hyperglycemia: Secondary | ICD-10-CM

## 2015-03-07 DIAGNOSIS — E039 Hypothyroidism, unspecified: Secondary | ICD-10-CM | POA: Diagnosis not present

## 2015-03-07 DIAGNOSIS — Z794 Long term (current) use of insulin: Secondary | ICD-10-CM | POA: Diagnosis not present

## 2015-03-07 NOTE — [facility] Notes (Signed)
Patient ID: Alicia Horne, female   DOB: 10-17-1965, 50 y.o.   MRN: [id number]  HPI: Alicia Horne is a 50 y.o.-year-old female, returning for f/u for DM2, dx in 2001 (GDM 1998), insulin-dependent since 2015, uncontrolled, without complications. Last visit 3 mo ago. New PCP: Alicia Horne ([facility]). Insurance: [employer], Rx insurance: [employer].   She developed a skin yeast inf under L breast.   She will have gastric bypass. At the end of the year she will have the GBP.  Last hemoglobin A1c was: Lab Results  Component Value Date   HGBA1C 7.1 12/05/2014   HGBA1C 7.8* 08/30/2014   HGBA1C 7.6 04/13/2014   Pt is on a regimen of: - Metformin 1000 -1000 mg (better tolerated after cholecystectomy 2013) - Amaryl 4 mg in am and 2 mg before dinner - Toujeo 50 units 2x a day - started 07/28/2014  - Invokana 100 mg in am before breakfast  - started 08/08/2014 We stopped NPH 70 units in am and 40 units at bedtime Previously on Lantus (insurance coverage) 60 units bid, she felt better on this. She can get Lantus with 60$ a mo.  She tried Victoza >> nausea.  Pt checks her sugars 2x a day and they are: - am: on Lantus 120-130; NPH: 80-85, occasionally 116, 120 >> 110-133 >> 84-115 >> 91-121, 150 - 2h after b'fast: n/c >> 124-136 >> n/c >> 132 - before lunch: n/c >> 109-132 >> n/c >> 119-141, 177 - 2h after lunch: 180-212 >> 124-136 >> 120-130s >> 132-137 - before dinner: n/c >> 118-139 >> 125 >> 124-145 - 2h after dinner: n/c >> 141, 156 >> n/c >> 110-124 - bedtime: n/c >> 138-148, 160 >> 145, 160 >> 132-148 - nighttime: n/c >> 131-182 No lows. Lowest sugar was 80's; she has hypoglycemia awareness at 80.  Highest sugar was 350 >> 170 >> 160 >> 190.  Glucometer: ReliOn  Pt's meals are: - Breakfast: eggs + toast or yoghurt (may skip) - Lunch: sandwich + chips - Dinner: meat + 2 veg - Snacks: 2  - no CKD, last BUN/creatinine:  Lab Results  Component Value Date   BUN 20 08/30/2014   CREATININE 0.98 08/30/2014  On Lisinopril. - last set of lipids: Lab Results  Component Value Date   CHOL 191 12/30/2013   HDL 42 12/30/2013   LDLCALC 127* 12/30/2013   TRIG 111 12/30/2013   CHOLHDL 4.5 12/30/2013  On Pravastatin. - last eye exam was in 02/23/2014. No DR.  - + numbness and tingling in her feet.  Hypothyroidism: - last tsh: Lab Results  Component Value Date   TSH 2.19 08/30/2014   She takes 137 mcg LT4 daily. - in am - fasting - eats b'fast >30 min later - no Ca, Fe, MVI, PPI  She had a partial nephrectomy for renal cancer. She also has HTN, HL, fatty liver, hypothyroidism, h/o anemia.  ROS: Constitutional: no weight gain, + fatigue, no subjective hyperthermia Eyes: no blurry vision, no xerophthalmia ENT: no sore throat, no nodules palpated in throat, no dysphagia/odynophagia, no hoarseness Cardiovascular: no CP/SOB/palpitations/resolved leg swelling Respiratory: no cough/SOB Gastrointestinal: no N/V/D/C Musculoskeletal: no muscle/joint aches Skin: no rashes Neurological: no tremors/numbness/tingling/dizziness  I reviewed pt's medications, allergies, PMH, social hx, family hx, and changes were documented in the history of present illness. Otherwise, unchanged from my initial visit note:  Past Medical History  Diagnosis Date  . Anemia   . Diabetes mellitus   . Hyperlipidemia   .  Hypertension   . Back pain, chronic   . Gallstones   . Renal mass, left   . Hypothyroidism   . Morbid obesity ([place])   . Renal cell cancer [facility])    Past Surgical History  Procedure Laterality Date  . Cesarean section  1992 and 1998  . Tubal ligation  1998  . Ectopic pregnancy surgery  1994  . Cholecystectomy  07/03/2011    Procedure: LAPAROSCOPIC CHOLECYSTECTOMY WITH INTRAOPERATIVE CHOLANGIOGRAM;  Surgeon: Odis Hollingshead, MD;  Location: [facility];  Service: General;  Laterality: N/A;   History   Social History  . Marital Status: Married    Spouse Name: N/A  .  Number of Children: 2   Occupational History  . A/R clerk   Social History Main Topics  . Smoking status: Never Smoker   . Smokeless tobacco: Not on file  . Alcohol Use: No  . Drug Use: No   Current Outpatient Prescriptions on File Prior to Visit  Medication Sig Dispense Refill  . albuterol (PROVENTIL HFA;VENTOLIN HFA) 108 (90 BASE) MCG/ACT inhaler Inhale 2 puffs into the lungs every 6 (six) hours as needed for wheezing or shortness of breath. 1 Inhaler 0  . glimepiride (AMARYL) 4 MG tablet TAKE 1 tablet qam and 1/2 TABLETS BY MOUTH IN THE EVENING. 135 tablet 1  . Insulin Glargine (TOUJEO SOLOSTAR) 300 UNIT/ML SOPN Inject 50 Units into the skin 2 (two) times daily. 18 pen 2  . Insulin Pen Needle (CAREFINE PEN NEEDLES) 32G X 4 MM MISC Use 2x a day 200 each 5  . INVOKANA 100 MG TABS tablet TAKE ONE TABLET BY MOUTH ONCE DAILY 30 tablet 1  . levothyroxine (SYNTHROID, LEVOTHROID) 137 MCG tablet Take 1 tablet (137 mcg total) by mouth daily before breakfast. 90 tablet 1  . lidocaine (LIDODERM) 5 % Place 1 patch onto the skin daily. Remove & Discard patch within 12 hours or as directed by MD    . lisinopril-hydrochlorothiazide (PRINZIDE,ZESTORETIC) 20-12.5 MG per tablet TAKE ONE TABLET BY MOUTH ONCE DAILY WITH BREAKFAST 90 tablet 0  . metFORMIN (GLUCOPHAGE) 1000 MG tablet One tablet qam , one half tablet at noon and one tablet qpm 225 tablet 1  . Multiple Vitamins-Minerals (MULTIVITAMIN WITH MINERALS) tablet Take 1 tablet by mouth daily.    Marland Kitchen oxyCODONE (OXYCONTIN) 10 MG 12 hr tablet Take 10 mg by mouth every 12 (twelve) hours as needed for pain.    . pravastatin (PRAVACHOL) 80 MG tablet Take 1 tablet (80 mg total) by mouth daily. 90 tablet 1   No current facility-administered medications on file prior to visit.   Allergies  Allergen Reactions  . Victoza [Liraglutide]   . Tape Other (See Comments)    Makes her skin burn   Family History  Problem Relation Age of Onset  . Hypertension Mother    . Hypertension Father   . Diabetes Father    PE: BP 108/62 mmHg  Pulse 82  Temp(Src) 98.4 F (36.9 C) (Oral)  Resp 12  Wt 284 lb 9.6 oz (129.094 kg)  SpO2 96% Body mass index is 50.43 kg/(m^2). Wt Readings from Last 3 Encounters:  03/07/15 284 lb 9.6 oz (129.094 kg)  12/05/14 284 lb 6.4 oz (129.003 kg)  09/04/14 291 lb 9.6 oz (132.269 kg)   Constitutional: obese, in NAD Eyes: PERRLA, EOMI, no exophthalmos ENT: moist mucous membranes, no thyromegaly, no cervical lymphadenopathy Cardiovascular: RRR, No MRG, + nonpitting periankle edema Respiratory: CTA B Gastrointestinal: abdomen soft, NT, ND,  BS+ Musculoskeletal: no deformities, strength intact in all 4 Skin: moist, warm, no rashes Neurological: no tremor with outstretched hands, DTR normal in all 4  ASSESSMENT: 1. DM2, insulin-dependent, uncontrolled, without complications - ? PN  2. Hypothyroidism  PLAN:  1. Patient with long-standing, uncontrolled diabetes, on oral antidiabetic regimen + basal insulin, now with better control, only slightly higher sugars over the Holidays.  - I suggested to:  Patient Instructions  Please continue: - Amaryl 4 mg in am and 2 mg before dinner - Invokana 100 mg in am before breakfast  - Metformin to 1000 mg in am and 1000 mg in pm - Toujeo 50 units in am and 50 units at bedtime  Please come back for a follow-up appointment in 3 months.  - continue checking sugars at different times of the day - check 2-3 times a day, rotating checks - Needs a new eye exam - gave her a flu shot at last visit - will have a Lipid panel in 2 weeks by PCP - discussed about GBP >> agree with gastric sleeve - check hbA1c today - Return to clinic in 3 mo  2. Hypothyroidism - on LT4 137 mcg daily, taken correctly - TFTs normal at last check >> recheck today  Needs refills of Metformin, Invokana and LT4 when labs are back.  Office Visit on 03/07/2015  Component Date Value Ref Range Status  . Free T4  03/07/2015 1.02  0.60 - 1.60 ng/dL Final  . TSH 03/07/2015 2.11  0.35 - 4.50 uIU/mL Final  . Hgb A1c MFr Bld 03/07/2015 7.9* 4.6 - 6.5 % Final   Glycemic Control Guidelines for People with Diabetes:Non Diabetic:  <6%Goal of Therapy: <7%Additional Action Suggested:  >8%    TFTs normal, will refill LT4. HbA1c higher. Will not change regimen

## 2015-03-07 NOTE — Patient Instructions (Signed)
Please continue: - Amaryl 4 mg in am and 2 mg before dinner - Invokana 100 mg in am before breakfast  - Metformin to 1000 mg in am and 1000 mg in pm - Toujeo to 50 units in am and 50 units at bedtime  Please come back for a follow-up appointment in 3 months.

## 2015-03-08 LAB — HEMOGLOBIN A1C: HEMOGLOBIN A1C: 7.9 % — AB (ref 4.6–6.5)

## 2015-03-08 LAB — T4, FREE: FREE T4: 1.02 ng/dL (ref 0.60–1.60)

## 2015-03-08 LAB — TSH: TSH: 2.11 u[IU]/mL (ref 0.35–4.50)

## 2015-03-08 MED ORDER — METFORMIN HCL 1000 MG PO TABS: ORAL_TABLET | ORAL | Status: DC

## 2015-03-08 MED ORDER — LEVOTHYROXINE SODIUM 137 MCG PO TABS: 137.0000 ug | ORAL_TABLET | Freq: Every day | ORAL | Status: DC

## 2015-03-08 MED ORDER — CANAGLIFLOZIN 100 MG PO TABS: 100.0000 mg | ORAL_TABLET | Freq: Every day | ORAL | Status: DC

## 2015-03-22 ENCOUNTER — Other Ambulatory Visit: Payer: Self-pay | Admitting: Internal Medicine

## 2015-04-03 ENCOUNTER — Other Ambulatory Visit: Payer: Self-pay | Admitting: Surgical Oncology

## 2015-04-03 DIAGNOSIS — K760 Fatty (change of) liver, not elsewhere classified: Secondary | ICD-10-CM

## 2015-04-03 DIAGNOSIS — E1165 Type 2 diabetes mellitus with hyperglycemia: Secondary | ICD-10-CM

## 2015-04-03 DIAGNOSIS — Z794 Long term (current) use of insulin: Secondary | ICD-10-CM

## 2015-04-03 DIAGNOSIS — I10 Essential (primary) hypertension: Secondary | ICD-10-CM

## 2015-04-05 ENCOUNTER — Ambulatory Visit
Admission: RE | Admit: 2015-04-05 | Discharge: 2015-04-05 | Disposition: A | Discharge status: Home / Self Care | Payer: No Typology Code available for payment source | Source: Ambulatory Visit | Attending: Surgical Oncology | Admitting: Surgical Oncology

## 2015-04-05 DIAGNOSIS — K760 Fatty (change of) liver, not elsewhere classified: Secondary | ICD-10-CM

## 2015-04-05 DIAGNOSIS — E1165 Type 2 diabetes mellitus with hyperglycemia: Secondary | ICD-10-CM

## 2015-04-05 DIAGNOSIS — Z794 Long term (current) use of insulin: Secondary | ICD-10-CM

## 2015-04-05 DIAGNOSIS — I10 Essential (primary) hypertension: Secondary | ICD-10-CM

## 2015-05-16 ENCOUNTER — Telehealth: Payer: Self-pay | Admitting: *Deleted

## 2015-05-16 NOTE — Telephone Encounter (Signed)
Called pt and lvm advising her that her insulin is here from patient assistance to pick up.

## 2015-05-31 ENCOUNTER — Other Ambulatory Visit (INDEPENDENT_AMBULATORY_CARE_PROVIDER_SITE_OTHER): Payer: BLUE CROSS/BLUE SHIELD | Admitting: *Deleted

## 2015-05-31 ENCOUNTER — Ambulatory Visit (INDEPENDENT_AMBULATORY_CARE_PROVIDER_SITE_OTHER): Payer: BLUE CROSS/BLUE SHIELD | Admitting: Internal Medicine

## 2015-05-31 ENCOUNTER — Encounter: Payer: Self-pay | Admitting: Internal Medicine

## 2015-05-31 VITALS — BP 108/60 | HR 71 | Temp 97.8°F | Resp 12 | Wt 283.0 lb

## 2015-05-31 DIAGNOSIS — E1165 Type 2 diabetes mellitus with hyperglycemia: Secondary | ICD-10-CM

## 2015-05-31 DIAGNOSIS — Z794 Long term (current) use of insulin: Secondary | ICD-10-CM | POA: Diagnosis not present

## 2015-05-31 DIAGNOSIS — E119 Type 2 diabetes mellitus without complications: Secondary | ICD-10-CM | POA: Insufficient documentation

## 2015-05-31 LAB — POCT GLYCOSYLATED HEMOGLOBIN (HGB A1C): HEMOGLOBIN A1C: 6.4

## 2015-05-31 MED ORDER — INSULIN GLARGINE 300 UNIT/ML ~~LOC~~ SOPN: 50.0000 [IU] | PEN_INJECTOR | Freq: Every day | SUBCUTANEOUS | Status: DC

## 2015-05-31 MED ORDER — GLIMEPIRIDE 4 MG PO TABS: ORAL_TABLET | ORAL | Status: DC

## 2015-05-31 NOTE — Patient Instructions (Signed)
Please decrease: - Amaryl to 2 mg in am and 2 mg before dinner - Toujeo to 50 units at bedtime. Please decrease the dose by 5 units every 5 days if sugars are as good as now.  Please continue: - Invokana 100 mg in am before breakfast  - Metformin to 1000 mg in am and 1000 mg in pm  When you start the low carb diet, stop Amaryl.  Please come back for a follow-up appointment in 3 months.

## 2015-05-31 NOTE — [facility] Notes (Signed)
Patient ID: Alicia Horne, female   DOB: 1966/02/08, 50 y.o.   MRN: [id number]  HPI: Alicia Horne is a 50 y.o.-year-old female, returning for f/u for DM2, dx in 2001 (GDM 1998), insulin-dependent since 2015, uncontrolled, without complications. Last visit 3 mo ago.  PCP: Alicia Horne ([facility]). Insurance: [employer], Rx insurance: [employer].   She started to improved diet and take meds as advised >> sugars better.  She will have Gastric sleeve - 05-07/2015. 2-3 weeks before surgery >> she will start 2-3 weeks before the surgery: 20g carb/day diet.  Last hemoglobin A1c was: Lab Results  Component Value Date   HGBA1C 7.9* 03/07/2015   HGBA1C 7.1 12/05/2014   HGBA1C 7.8* 08/30/2014   Pt is on a regimen of: - Metformin 1000 mg 2x a day (better tolerated after cholecystectomy 2013) - Amaryl 4 mg in am and 2 mg before dinner - Toujeo 50 units 2x a day - started 07/28/2014 - Invokana 100 mg in am before breakfast  - started 08/08/2014 We stopped NPH 70 units in am and 40 units at bedtime Previously on Lantus (insurance coverage) 60 units bid, she felt better on this. She can get Lantus with 60$ a mo.  She tried Victoza >> nausea.  Pt checks her sugars 2-3x a day and they are: - am: on Lantus 120-130; NPH: 80-85, occasionally 116, 120 >> 110-133 >> 84-115 >> 91-121, 150 >> 80-94 - 2h after b'fast: n/c >> 124-136 >> n/c >> 132 >> <130 - before lunch: n/c >> 109-132 >> n/c >> 119-141, 177 >> 110-115 - 2h after lunch: 180-212 >> 124-136 >> 120-130s >> 132-137 >> <130 - before dinner: n/c >> 118-139 >> 125 >> 124-145 >> 110-115, 120 - 2h after dinner: n/c >> 141, 156 >> n/c >> 110-124 >> <130 - bedtime: n/c >> 138-148, 160 >> 145, 160 >> 132-148 >> 100-110 - nighttime: n/c >> 131-182 No lows. Lowest sugar was 80's; she has hypoglycemia awareness at 80.  Highest sugar was 350 >> 170 >> 160 >> 190 >> 130  Glucometer: ReliOn  - no CKD, last BUN/creatinine:  Lab Results  Component Value  Date   BUN 20 08/30/2014   CREATININE 0.98 08/30/2014  On Lisinopril. - last set of lipids: Lab Results  Component Value Date   CHOL 191 12/30/2013   HDL 42 12/30/2013   LDLCALC 127* 12/30/2013   TRIG 111 12/30/2013   CHOLHDL 4.5 12/30/2013  On Pravastatin. - last eye exam was in 04/2015. No DR.  - + numbness and tingling in her feet.  Hypothyroidism: - last tsh: Lab Results  Component Value Date   TSH 2.11 03/07/2015   She takes 137 mcg LT4 daily. - in am - fasting - eats b'fast >30 min later - no Ca, Fe, MVI, PPI  She had a partial nephrectomy for renal cancer. She also has HTN, HL, fatty liver, hypothyroidism, h/o anemia.  ROS: Constitutional: + weight loss, no fatigue, no subjective hyperthermia Eyes: no blurry vision, no xerophthalmia ENT: no sore throat, no nodules palpated in throat, no dysphagia/odynophagia, no hoarseness Cardiovascular: no CP/SOB/palpitations/resolved leg swelling Respiratory: no cough/SOB Gastrointestinal: no N/V/D/C Musculoskeletal: no muscle/joint aches Skin: no rashes Neurological: no tremors/numbness/tingling/dizziness  I reviewed pt's medications, allergies, PMH, social hx, family hx, and changes were documented in the history of present illness. Otherwise, unchanged from my initial visit note:  Past Medical History  Diagnosis Date  . Anemia   . Diabetes mellitus   . Hyperlipidemia   .  Hypertension   . Back pain, chronic   . Gallstones   . Renal mass, left   . Hypothyroidism   . Morbid obesity ([place])   . Renal cell cancer [facility])    Past Surgical History  Procedure Laterality Date  . Cesarean section  1992 and 1998  . Tubal ligation  1998  . Ectopic pregnancy surgery  1994  . Cholecystectomy  07/03/2011    Procedure: LAPAROSCOPIC CHOLECYSTECTOMY WITH INTRAOPERATIVE CHOLANGIOGRAM;  Surgeon: Odis Hollingshead, MD;  Location: [facility];  Service: General;  Laterality: N/A;   History   Social History  . Marital Status: Married     Spouse Name: N/A  . Number of Children: 2   Occupational History  . A/R clerk   Social History Main Topics  . Smoking status: Never Smoker   . Smokeless tobacco: Not on file  . Alcohol Use: No  . Drug Use: No   Current Outpatient Prescriptions on File Prior to Visit  Medication Sig Dispense Refill  . albuterol (PROVENTIL HFA;VENTOLIN HFA) 108 (90 BASE) MCG/ACT inhaler Inhale 2 puffs into the lungs every 6 (six) hours as needed for wheezing or shortness of breath. 1 Inhaler 0  . canagliflozin (INVOKANA) 100 MG TABS tablet Take 1 tablet (100 mg total) by mouth daily. 30 tablet 5  . glimepiride (AMARYL) 4 MG tablet TAKE ONE TABLET BY MOUTH IN THE MORNING AND ONE-HALF TABLET IN THE EVENING 135 tablet 0  . Insulin Glargine (TOUJEO SOLOSTAR) 300 UNIT/ML SOPN Inject 50 Units into the skin 2 (two) times daily. 18 pen 2  . Insulin Pen Needle (CAREFINE PEN NEEDLES) 32G X 4 MM MISC Use 2x a day 200 each 5  . levothyroxine (SYNTHROID, LEVOTHROID) 137 MCG tablet Take 1 tablet (137 mcg total) by mouth daily before breakfast. 90 tablet 3  . lidocaine (LIDODERM) 5 % Place 1 patch onto the skin daily. Remove & Discard patch within 12 hours or as directed by MD    . lisinopril-hydrochlorothiazide (PRINZIDE,ZESTORETIC) 20-12.5 MG per tablet TAKE ONE TABLET BY MOUTH ONCE DAILY WITH BREAKFAST 90 tablet 0  . metFORMIN (GLUCOPHAGE) 1000 MG tablet Take by mouth 1000 mg 2x a day with meals 180 tablet 1  . Multiple Vitamins-Minerals (MULTIVITAMIN WITH MINERALS) tablet Take 1 tablet by mouth daily.    Marland Kitchen oxyCODONE (OXYCONTIN) 10 MG 12 hr tablet Take 10 mg by mouth every 12 (twelve) hours as needed for pain.    . pravastatin (PRAVACHOL) 80 MG tablet Take 1 tablet (80 mg total) by mouth daily. 90 tablet 1   No current facility-administered medications on file prior to visit.   Allergies  Allergen Reactions  . Victoza [Liraglutide]   . Tape Other (See Comments)    Makes her skin burn   Family History  Problem  Relation Age of Onset  . Hypertension Mother   . Hypertension Father   . Diabetes Father    PE: BP 108/60 mmHg  Pulse 71  Temp(Src) 97.8 F (36.6 C) (Oral)  Resp 12  Wt 283 lb (128.368 kg)  SpO2 97% Body mass index is 50.14 kg/(m^2). Wt Readings from Last 3 Encounters:  05/31/15 283 lb (128.368 kg)  03/07/15 284 lb 9.6 oz (129.094 kg)  12/05/14 284 lb 6.4 oz (129.003 kg)   Constitutional: obese, in NAD Eyes: PERRLA, EOMI, no exophthalmos ENT: moist mucous membranes, no thyromegaly, no cervical lymphadenopathy Cardiovascular: RRR, No MRG, no periankle edema Respiratory: CTA B Gastrointestinal: abdomen soft, NT, ND, BS+ Musculoskeletal: no  deformities, strength intact in all 4 Skin: moist, warm, no rashes Neurological: no tremor with outstretched hands, DTR normal in all 4  ASSESSMENT: 1. DM2, insulin-dependent, uncontrolled, without complications - ? PN  2. Hypothyroidism  PLAN:  1. Patient with long-standing, uncontrolled diabetes, on oral antidiabetic regimen + basal insulin, now with [facility] better control after she started to control her diet and be more active - in preparation for the sleeve gastrectomy in 1-2 mo. We can reduce the Lantus and Amaryl doses. - pt will get in touch with me Re: her sugars in ~2 weeks. - I suggested to:  Patient Instructions  Please decrease: - Amaryl to 2 mg in am and 2 mg before dinner - Toujeo to 50 units at bedtime. Please decrease the dose by 5 units every 5 days if sugars are as good as now.  Please continue: - Invokana 100 mg in am before breakfast  - Metformin to 1000 mg in am and 1000 mg in pm  When you start the low carb diet, stop Amaryl.  Please come back for a follow-up appointment in 3 months.  - continue checking sugars at different times of the day - check 2-3 times a day, rotating checks - UTD with eye exams - gave her a flu shot this season - check hbA1c today >> 6.4% (great!!) - Return to clinic in 3 mo  2.  Hypothyroidism - on LT4 137 mcg daily, taken correctly - managed by PCP

## 2015-06-18 ENCOUNTER — Other Ambulatory Visit: Payer: Self-pay | Admitting: *Deleted

## 2015-06-18 ENCOUNTER — Encounter: Payer: Self-pay | Admitting: Internal Medicine

## 2015-06-18 MED ORDER — GLIMEPIRIDE 4 MG PO TABS: ORAL_TABLET | ORAL | Status: DC

## 2015-06-18 NOTE — Telephone Encounter (Signed)
Refill request via MyChart.

## 2015-07-08 ENCOUNTER — Encounter: Payer: Self-pay | Admitting: Internal Medicine

## 2015-07-10 ENCOUNTER — Encounter: Payer: Self-pay | Admitting: Internal Medicine

## 2015-07-10 ENCOUNTER — Encounter: Payer: Self-pay | Admitting: *Deleted

## 2015-07-30 ENCOUNTER — Other Ambulatory Visit: Payer: Self-pay | Admitting: *Deleted

## 2015-07-30 MED ORDER — EMPAGLIFLOZIN 25 MG PO TABS: 25.0000 mg | ORAL_TABLET | Freq: Every day | ORAL | Status: DC

## 2015-07-30 NOTE — Telephone Encounter (Signed)
Ins will not cover Invokana. Changing to Jardiance 25 mg daily per Dr Cruzita Lederer.

## 2015-08-13 DIAGNOSIS — Z713 Dietary counseling and surveillance: Secondary | ICD-10-CM | POA: Diagnosis not present

## 2015-08-13 DIAGNOSIS — Z112 Encounter for screening for other bacterial diseases: Secondary | ICD-10-CM | POA: Diagnosis not present

## 2015-08-13 DIAGNOSIS — Z01818 Encounter for other preprocedural examination: Secondary | ICD-10-CM | POA: Diagnosis not present

## 2015-08-18 ENCOUNTER — Encounter: Payer: Self-pay | Admitting: Internal Medicine

## 2015-08-19 ENCOUNTER — Other Ambulatory Visit: Payer: Self-pay | Admitting: Internal Medicine

## 2015-08-19 ENCOUNTER — Encounter: Payer: Self-pay | Admitting: Internal Medicine

## 2015-08-19 DIAGNOSIS — B373 Candidiasis of vulva and vagina: Secondary | ICD-10-CM

## 2015-08-19 DIAGNOSIS — B3731 Acute candidiasis of vulva and vagina: Secondary | ICD-10-CM

## 2015-08-19 MED ORDER — FLUCONAZOLE 150 MG PO TABS: 150.0000 mg | ORAL_TABLET | Freq: Once | ORAL | Status: DC

## 2015-09-02 ENCOUNTER — Ambulatory Visit (INDEPENDENT_AMBULATORY_CARE_PROVIDER_SITE_OTHER): Payer: BLUE CROSS/BLUE SHIELD | Admitting: Internal Medicine

## 2015-09-02 ENCOUNTER — Encounter: Payer: Self-pay | Admitting: Internal Medicine

## 2015-09-02 VITALS — BP 132/78 | HR 71 | Ht 64.0 in | Wt 281.0 lb

## 2015-09-02 DIAGNOSIS — Z794 Long term (current) use of insulin: Secondary | ICD-10-CM | POA: Diagnosis not present

## 2015-09-02 DIAGNOSIS — E1165 Type 2 diabetes mellitus with hyperglycemia: Secondary | ICD-10-CM

## 2015-09-02 MED ORDER — FLUCONAZOLE 150 MG PO TABS: 150.0000 mg | ORAL_TABLET | Freq: Once | ORAL | Status: DC

## 2015-09-02 MED ORDER — INSULIN GLARGINE 300 UNIT/ML ~~LOC~~ SOPN: 40.0000 [IU] | PEN_INJECTOR | Freq: Every day | SUBCUTANEOUS | Status: DC

## 2015-09-02 NOTE — Patient Instructions (Signed)
Please continue: - Jardiance 25 mg in am before breakfast  - Metformin 1000 mg in am and 1000 mg in pm  Please decrease: - Toujeo to 40 units at bedtime   Please stop: - Amaryl 2 mg in am and 2 mg before dinner  Please let me know about your sugars in 1 week.  Please come back for a follow-up appointment in 3 months.

## 2015-09-02 NOTE — [facility] Notes (Signed)
Patient ID: Alicia Horne, female   DOB: Feb 22, 1966, 50 y.o.   MRN: [id number]  HPI: Alicia Horne is a 50 y.o.-year-old femaleyear-old female, returning for f/u for DM2, dx in 2001 (GDM 1998), insulin-dependent since 2015, uncontrolled, without complications. Last visit 3 mo ago.  PCP: Velna Hatchet ([facility]). Insurance: [employer], Rx insurance: [employer].   She will have Gastric sleeve - 09/24/2015. Starts a 20g carb/day diet tomorrow.  Last hemoglobin A1c was: Lab Results  Component Value Date   HGBA1C 6.4 05/31/2015   HGBA1C 7.9* 03/07/2015   HGBA1C 7.1 12/05/2014   Pt is on a regimen of: - Metformin 1000 mg 2x a day (better tolerated after cholecystectomy 2013) - Amaryl 4 mg in am and 2 mg before dinner - Toujeo 50 units daily - started 07/28/2014 - Invokana 100 mg >> Jardiance 25 mg in am before breakfast  We stopped NPH 70 units in am and 40 units at bedtime Previously on Lantus (insurance coverage) 60 units bid, she felt better on this. She can get Lantus with 60$ a mo.  She tried Victoza >> nausea.  Pt checks her sugars 2-3x a day and they are: - am: on Lantus 120-130; NPH: 80-85, occasionally 116, 120 >> 110-133 >> 84-115 >> 91-121, 150 >> 80-94 >> 60x1, 80s - 2h after b'fast: n/c >> 124-136 >> n/c >> 132 >> <130 >> n/c - before lunch: n/c >> 109-132 >> n/c >> 119-141, 177 >> 110-115 >> 99-117 - 2h after lunch: 180-212 >> 124-136 >> 120-130s >> 132-137 >> <130 >> 110-141 - before dinner: n/c >> 118-139 >> 125 >> 124-145 >> 110-115, 120 >> 90-120  - 2h after dinner: n/c >> 141, 156 >> n/c >> 110-124 >> <130 >> 128, 129 - bedtime: n/c >> 138-148, 160 >> 145, 160 >> 132-148 >> 100-110 >> 120-142 - nighttime: n/c >> 131-182 >> see above No lows. Lowest sugar was 80's; she has hypoglycemia awareness at 80.  Highest sugar was 350 >> 170 >> 160 >> 190 >> 130 >> 145.  Glucometer: ReliOn  - no CKD, last BUN/creatinine:  Lab Results  Component Value Date   BUN 20 08/30/2014   CREATININE  0.98 08/30/2014  On Lisinopril. - last set of lipids: Lab Results  Component Value Date   CHOL 191 12/30/2013   HDL 42 12/30/2013   LDLCALC 127* 12/30/2013   TRIG 111 12/30/2013   CHOLHDL 4.5 12/30/2013  On Pravastatin. - last eye exam was in 04/2015. No DR.  - + numbness and tingling in her feet.  Hypothyroidism: - last tsh: Lab Results  Component Value Date   TSH 2.11 03/07/2015   She takes 137 mcg LT4 daily. - in am - fasting - eats b'fast >30 min later - no Ca, Fe, MVI, PPI  She had a partial nephrectomy for renal cancer. She also has HTN, HL, fatty liver, hypothyroidism, h/o anemia.  ROS: Constitutional: + weight loss, no fatigue, no subjective hyperthermia Eyes: no blurry vision, no xerophthalmia ENT: no sore throat, no nodules palpated in throat, no dysphagia/odynophagia, no hoarseness Cardiovascular: no CP/SOB/palpitations/resolved leg swelling Respiratory: no cough/SOB Gastrointestinal: no N/V/D/C Musculoskeletal: no muscle/joint aches Skin: no rashes Neurological: no tremors/numbness/tingling/dizziness  I reviewed pt's medications, allergies, PMH, social hx, family hx, and changes were documented in the history of present illness. Otherwise, unchanged from my initial visit note:  Past Medical History  Diagnosis Date  . Anemia   . Diabetes mellitus   . Hyperlipidemia   . Hypertension   .  Back pain, chronic   . Gallstones   . Renal mass, left   . Hypothyroidism   . Morbid obesity ([place])   . Renal cell cancer [facility])    Past Surgical History  Procedure Laterality Date  . Cesarean section  1992 and 1998  . Tubal ligation  1998  . Ectopic pregnancy surgery  1994  . Cholecystectomy  07/03/2011    Procedure: LAPAROSCOPIC CHOLECYSTECTOMY WITH INTRAOPERATIVE CHOLANGIOGRAM;  Surgeon: Odis Hollingshead, MD;  Location: [facility];  Service: General;  Laterality: N/A;   History   Social History  . Marital Status: Married    Spouse Name: N/A  . Number of  Children: 2   Occupational History  . A/R clerk   Social History Main Topics  . Smoking status: Never Smoker   . Smokeless tobacco: Not on file  . Alcohol Use: No  . Drug Use: No   Current Outpatient Prescriptions on File Prior to Visit  Medication Sig Dispense Refill  . albuterol (PROVENTIL HFA;VENTOLIN HFA) 108 (90 BASE) MCG/ACT inhaler Inhale 2 puffs into the lungs every 6 (six) hours as needed for wheezing or shortness of breath. 1 Inhaler 0  . empagliflozin (JARDIANCE) 25 MG TABS tablet Take 25 mg by mouth daily. 30 tablet 2  . fluconazole (DIFLUCAN) 150 MG tablet Take 1 tablet (150 mg total) by mouth once. 1 tablet 1  . glimepiride (AMARYL) 4 MG tablet Take 2 mg 2x a day, before meals 90 tablet 1  . Insulin Glargine (TOUJEO SOLOSTAR) 300 UNIT/ML SOPN Inject 50 Units into the skin at bedtime. 18 pen 2  . Insulin Pen Needle (CAREFINE PEN NEEDLES) 32G X 4 MM MISC Use 2x a day 200 each 5  . levothyroxine (SYNTHROID, LEVOTHROID) 137 MCG tablet Take 1 tablet (137 mcg total) by mouth daily before breakfast. 90 tablet 3  . lisinopril-hydrochlorothiazide (PRINZIDE,ZESTORETIC) 20-12.5 MG per tablet TAKE ONE TABLET BY MOUTH ONCE DAILY WITH BREAKFAST 90 tablet 0  . metFORMIN (GLUCOPHAGE) 1000 MG tablet Take by mouth 1000 mg 2x a day with meals 180 tablet 1  . Multiple Vitamins-Minerals (MULTIVITAMIN WITH MINERALS) tablet Take 1 tablet by mouth daily.    . pravastatin (PRAVACHOL) 80 MG tablet Take 1 tablet (80 mg total) by mouth daily. 90 tablet 1  . lidocaine (LIDODERM) 5 % Place 1 patch onto the skin daily. Reported on 09/02/2015    . oxyCODONE (OXYCONTIN) 10 MG 12 hr tablet Take 10 mg by mouth every 12 (twelve) hours as needed for pain. Reported on 09/02/2015     No current facility-administered medications on file prior to visit.   Allergies  Allergen Reactions  . Victoza [Liraglutide]   . Tape Other (See Comments)    Makes her skin burn   Family History  Problem Relation Age of Onset   . Hypertension Mother   . Hypertension Father   . Diabetes Father    PE: BP 132/78 mmHg  Pulse 71  Ht 5\' 4"  (1.626 m)  Wt 281 lb (127.461 kg)  BMI 48.21 kg/m2  SpO2 97% Body mass index is 48.21 kg/(m^2). Wt Readings from Last 3 Encounters:  09/02/15 281 lb (127.461 kg)  05/31/15 283 lb (128.368 kg)  03/07/15 284 lb 9.6 oz (129.094 kg)   Constitutional: obese, in NAD Eyes: PERRLA, EOMI, no exophthalmos ENT: moist mucous membranes, no thyromegaly, no cervical lymphadenopathy Cardiovascular: RRR, No MRG, no periankle edema Respiratory: CTA B Gastrointestinal: abdomen soft, NT, ND, BS+ Musculoskeletal: no deformities, strength intact in  all 4 Skin: moist, warm, no rashes Neurological: no tremor with outstretched hands, DTR normal in all 4  ASSESSMENT: 1. DM2, insulin-dependent, uncontrolled, without complications - ? PN  2. Hypothyroidism  PLAN:  1. Patient with long-standing, uncontrolled diabetes, on oral antidiabetic regimen + basal insulin, now with [facility] better control after she started to control her diet and be more active - in preparation for the sleeve gastrectomy in 1-2 mo. We can reduce the Lantus and Amaryl doses. - I suggested to:  Patient Instructions  Please continue: - Jardiance 25 mg in am before breakfast  - Metformin 1000 mg in am and 1000 mg in pm  Please decrease: - Toujeo to 40 units at bedtime   Please stop: - Amaryl 2 mg in am and 2 mg before dinner  Please let me know about your sugars in 1 week.  Please come back for a follow-up appointment in 3 months.  - continue checking sugars at different times of the day - check 2-3 times a day, rotating checks - UTD with eye exams - check hbA1c today >> 7% (higher) - Return to clinic in 3 mo  2. Hypothyroidism - on LT4 137 mcg daily, taken correctly - managed by PCP

## 2015-09-03 LAB — POCT GLYCOSYLATED HEMOGLOBIN (HGB A1C): Hemoglobin A1C: 7

## 2015-09-03 NOTE — Addendum Note (Signed)
Addended by: Caprice Beaver T on: 09/03/2015 07:58 AM   Modules accepted: Orders

## 2015-09-19 DIAGNOSIS — Z6841 Body Mass Index (BMI) 40.0 and over, adult: Secondary | ICD-10-CM | POA: Diagnosis not present

## 2015-09-19 DIAGNOSIS — Z8349 Family history of other endocrine, nutritional and metabolic diseases: Secondary | ICD-10-CM | POA: Diagnosis not present

## 2015-09-19 DIAGNOSIS — E039 Hypothyroidism, unspecified: Secondary | ICD-10-CM | POA: Diagnosis not present

## 2015-09-19 DIAGNOSIS — Z794 Long term (current) use of insulin: Secondary | ICD-10-CM | POA: Diagnosis not present

## 2015-09-19 DIAGNOSIS — E1165 Type 2 diabetes mellitus with hyperglycemia: Secondary | ICD-10-CM | POA: Diagnosis not present

## 2015-09-24 DIAGNOSIS — Z9851 Tubal ligation status: Secondary | ICD-10-CM | POA: Diagnosis not present

## 2015-09-24 DIAGNOSIS — K760 Fatty (change of) liver, not elsewhere classified: Secondary | ICD-10-CM | POA: Diagnosis not present

## 2015-09-24 DIAGNOSIS — Z905 Acquired absence of kidney: Secondary | ICD-10-CM | POA: Diagnosis not present

## 2015-09-24 DIAGNOSIS — E785 Hyperlipidemia, unspecified: Secondary | ICD-10-CM | POA: Diagnosis not present

## 2015-09-24 DIAGNOSIS — Z9049 Acquired absence of other specified parts of digestive tract: Secondary | ICD-10-CM | POA: Diagnosis not present

## 2015-09-24 DIAGNOSIS — Z6841 Body Mass Index (BMI) 40.0 and over, adult: Secondary | ICD-10-CM | POA: Diagnosis not present

## 2015-09-24 DIAGNOSIS — I10 Essential (primary) hypertension: Secondary | ICD-10-CM | POA: Diagnosis not present

## 2015-09-24 DIAGNOSIS — E039 Hypothyroidism, unspecified: Secondary | ICD-10-CM | POA: Diagnosis not present

## 2015-09-24 DIAGNOSIS — Z794 Long term (current) use of insulin: Secondary | ICD-10-CM | POA: Diagnosis not present

## 2015-09-24 DIAGNOSIS — K660 Peritoneal adhesions (postprocedural) (postinfection): Secondary | ICD-10-CM | POA: Diagnosis not present

## 2015-09-24 DIAGNOSIS — Z79899 Other long term (current) drug therapy: Secondary | ICD-10-CM | POA: Diagnosis not present

## 2015-09-24 DIAGNOSIS — Z7984 Long term (current) use of oral hypoglycemic drugs: Secondary | ICD-10-CM | POA: Diagnosis not present

## 2015-09-24 DIAGNOSIS — E1165 Type 2 diabetes mellitus with hyperglycemia: Secondary | ICD-10-CM | POA: Diagnosis not present

## 2015-09-24 HISTORY — PX: LAPAROSCOPIC GASTRIC SLEEVE RESECTION: SHX5895

## 2015-09-25 DIAGNOSIS — Z9049 Acquired absence of other specified parts of digestive tract: Secondary | ICD-10-CM | POA: Diagnosis not present

## 2015-09-25 DIAGNOSIS — Z7984 Long term (current) use of oral hypoglycemic drugs: Secondary | ICD-10-CM | POA: Diagnosis not present

## 2015-09-25 DIAGNOSIS — E1165 Type 2 diabetes mellitus with hyperglycemia: Secondary | ICD-10-CM | POA: Diagnosis not present

## 2015-09-25 DIAGNOSIS — Z9851 Tubal ligation status: Secondary | ICD-10-CM | POA: Diagnosis not present

## 2015-09-25 DIAGNOSIS — Z794 Long term (current) use of insulin: Secondary | ICD-10-CM | POA: Diagnosis not present

## 2015-09-25 DIAGNOSIS — E785 Hyperlipidemia, unspecified: Secondary | ICD-10-CM | POA: Diagnosis not present

## 2015-09-25 DIAGNOSIS — K760 Fatty (change of) liver, not elsewhere classified: Secondary | ICD-10-CM | POA: Diagnosis not present

## 2015-09-25 DIAGNOSIS — K660 Peritoneal adhesions (postprocedural) (postinfection): Secondary | ICD-10-CM | POA: Diagnosis not present

## 2015-09-25 DIAGNOSIS — Z905 Acquired absence of kidney: Secondary | ICD-10-CM | POA: Diagnosis not present

## 2015-09-25 DIAGNOSIS — E039 Hypothyroidism, unspecified: Secondary | ICD-10-CM | POA: Diagnosis not present

## 2015-09-25 DIAGNOSIS — Z6841 Body Mass Index (BMI) 40.0 and over, adult: Secondary | ICD-10-CM | POA: Diagnosis not present

## 2015-09-25 DIAGNOSIS — Z79899 Other long term (current) drug therapy: Secondary | ICD-10-CM | POA: Diagnosis not present

## 2015-09-25 DIAGNOSIS — I10 Essential (primary) hypertension: Secondary | ICD-10-CM | POA: Diagnosis not present

## 2015-10-04 ENCOUNTER — Encounter: Payer: Self-pay | Admitting: Internal Medicine

## 2015-10-23 DIAGNOSIS — Z9884 Bariatric surgery status: Secondary | ICD-10-CM | POA: Diagnosis not present

## 2015-10-23 DIAGNOSIS — Z713 Dietary counseling and surveillance: Secondary | ICD-10-CM | POA: Diagnosis not present

## 2015-12-03 ENCOUNTER — Other Ambulatory Visit: Payer: Self-pay

## 2015-12-03 ENCOUNTER — Encounter: Payer: Self-pay | Admitting: Internal Medicine

## 2015-12-03 ENCOUNTER — Ambulatory Visit (INDEPENDENT_AMBULATORY_CARE_PROVIDER_SITE_OTHER): Payer: BLUE CROSS/BLUE SHIELD | Admitting: Internal Medicine

## 2015-12-03 VITALS — BP 140/80 | HR 52 | Ht 63.5 in | Wt 244.0 lb

## 2015-12-03 DIAGNOSIS — Z794 Long term (current) use of insulin: Secondary | ICD-10-CM | POA: Diagnosis not present

## 2015-12-03 DIAGNOSIS — E1165 Type 2 diabetes mellitus with hyperglycemia: Secondary | ICD-10-CM

## 2015-12-03 DIAGNOSIS — E039 Hypothyroidism, unspecified: Secondary | ICD-10-CM

## 2015-12-03 LAB — POCT GLYCOSYLATED HEMOGLOBIN (HGB A1C): HEMOGLOBIN A1C: 7.4

## 2015-12-03 MED ORDER — METFORMIN HCL 1000 MG PO TABS: ORAL_TABLET | ORAL | 1 refills | Status: DC

## 2015-12-03 NOTE — Patient Instructions (Addendum)
Please increase Metformin to 500 mg 2x a day. You may need to increase to 1000 mg 2x a day if sugars are not at goal after the first increase.  Targets: - 80-130 (for you 80-115) before meals - <180 (for you <150) after meals  Please continue Levothyroxine 137 mcg daily.  Take the thyroid hormone every day, with water, at least 30 minutes before breakfast, separated by at least 4 hours from: - acid reflux medications - calcium - iron - multivitamins  Move Prilosec to dinnertime.  Come back for labs in 5-6 weeks.  Please return in 3 months with your sugar log.

## 2015-12-03 NOTE — Progress Notes (Signed)
Patient ID: Alicia Horne, female   DOB: [birth year], 50 y.o.   MRN: [id number]  HPI: Alicia Horne is a 50 y.o.-year-old female, returning for f/u for DM2, dx in 2001 (GDM 1998), insulin-dependent since 2015, uncontrolled, without complications. Last visit 3 mo ago.  PCP: Velna Hatchet ([facility]). Insurance: [employer], Rx insurance: [employer].   DM 2: She had Gastric sleeve in 09/24/2015. She lost 40 lbs already. She stopped insulin, Jardiance, Amaryl. We had to restart metformin, which is taking only 500 mg daily, as her sugars started to increase after she stopped all of the above medicines.  Last hemoglobin A1c was: Lab Results  Component Value Date   HGBA1C 7.0 09/03/2015   HGBA1C 6.4 05/31/2015   HGBA1C 7.9 (H) 03/07/2015   Pt was on a regimen of: - Metformin 1000 mg 2x a day (better tolerated after cholecystectomy 2013) - Amaryl 4 mg in am and 2 mg before dinner - Toujeo 50 units daily - started 07/28/2014 - Invokana 100 mg >> Jardiance 25 mg in am before breakfast  We stopped NPH 70 units in am and 40 units at bedtime Previously on Lantus (insurance coverage) 60 units bid, she felt better on this. She can get Lantus with 60$ a mo.  She tried Victoza >> nausea.  After the GBP: - Metformin 500 mg daily with lunch  Pt checks her sugars 2-3x a day and they are: - am: 110-133 >> 84-115 >> 91-121, 150 >> 80-94 >> 60x1, 80s >> 160s - 2h after b'fast: n/c >> 124-136 >> n/c >> 132 >> <130 >> n/c - before lunch: n/c >> 109-132 >> n/c >> 119-141, 177 >> 110-115 >> 99-117 >> n/c - 2h after lunch: 180-212 >> 124-136 >> 120-130s >> 132-137 >> <130 >> 110-141 >> n/c - before dinner: n/c >> 118-139 >> 125 >> 124-145 >> 110-115, 120 >> 90-120 >> n/c - 2h after dinner: n/c >> 141, 156 >> n/c >> 110-124 >> <130 >> 128, 129 >> n/c - bedtime: n/c >> 138-148, 160 >> 145, 160 >> 132-148 >> 100-110 >> 120-142 >> n/c - nighttime: n/c >> 131-182 >> see above No lows. Lowest sugar was 80's; she has  hypoglycemia awareness at 80.  Highest sugar was 350 >> 170 >> 160 >> 190 >> 130 >> 145 >> 160s.  Glucometer: ReliOn  - no CKD, last BUN/creatinine:  Lab Results  Component Value Date   BUN 20 08/30/2014   CREATININE 0.98 08/30/2014  On Lisinopril. - last set of lipids: Lab Results  Component Value Date   CHOL 191 12/30/2013   HDL 42 12/30/2013   LDLCALC 127 (H) 12/30/2013   TRIG 111 12/30/2013   CHOLHDL 4.5 12/30/2013  On Pravastatin. - last eye exam was in 04/2015. No DR.  - + numbness and tingling in her feet.  Hypothyroidism:  - last tsh: Lab Results  Component Value Date   TSH 2.11 03/07/2015   She takes 137 mcg LT4 daily. - in am - fasting - eats b'fast (shake) >30 min later - + PPI (Prilosec) at the same time as the LT4! - + MVI at lunch  She had a partial nephrectomy for renal cancer. She also has HTN, HL, fatty liver, hypothyroidism, h/o anemia.  ROS: Constitutional: +++ weight loss, no fatigue, no subjective hyperthermia Eyes: no blurry vision, no xerophthalmia ENT: no sore throat, no nodules palpated in throat, no dysphagia/odynophagia, no hoarseness Cardiovascular: no CP/SOB/palpitations/resolved leg swelling Respiratory: no cough/SOB Gastrointestinal: no N/V/D/C Musculoskeletal:  no muscle/joint aches Skin: no rashes Neurological: no tremors/numbness/tingling/dizziness  I reviewed pt's medications, allergies, PMH, social hx, family hx, and changes were documented in the history of present illness. Otherwise, unchanged from my initial visit note:  Past Medical History:  Diagnosis Date  . Anemia   . Back pain, chronic   . Diabetes mellitus   . Gallstones   . Hyperlipidemia   . Hypertension   . Hypothyroidism   . Morbid obesity ([place])   . Renal cell cancer ([place])   . Renal mass, left    Past Surgical History:  Procedure Laterality Date  . [place]  . CHOLECYSTECTOMY  07/03/2011   Procedure: LAPAROSCOPIC CHOLECYSTECTOMY  WITH INTRAOPERATIVE CHOLANGIOGRAM;  Surgeon: Odis Hollingshead, MD;  Location: [facility];  Service: General;  Laterality: N/A;  . [place]  . TUBAL LIGATION  1998   History   Social History  . Marital Status: Married    Spouse Name: N/A  . Number of Children: 2   Occupational History  . A/R clerk   Social History Main Topics  . Smoking status: Never Smoker   . Smokeless tobacco: Not on file  . Alcohol Use: No  . Drug Use: No   Current Outpatient Prescriptions on File Prior to Visit  Medication Sig Dispense Refill  . Insulin Glargine (TOUJEO SOLOSTAR) 300 UNIT/ML SOPN Inject 40 Units into the skin at bedtime. 18 pen 2  . Insulin Pen Needle (CAREFINE PEN NEEDLES) 32G X 4 MM MISC Use 2x a day 200 each 5  . levothyroxine (SYNTHROID, LEVOTHROID) 137 MCG tablet Take 1 tablet (137 mcg total) by mouth daily before breakfast. 90 tablet 3  . metFORMIN (GLUCOPHAGE) 1000 MG tablet Take by mouth 1000 mg 2x a day with meals 180 tablet 1  . Multiple Vitamins-Minerals (MULTIVITAMIN WITH MINERALS) tablet Take 1 tablet by mouth daily.     No current facility-administered medications on file prior to visit.    Allergies  Allergen Reactions  . Victoza [Liraglutide]   . Tape Other (See Comments)    Makes her skin burn   Family History  Problem Relation Age of Onset  . Hypertension Mother   . Hypertension Father   . Diabetes Father    PE: BP 140/80 (BP Location: Left Arm, Patient Position: Sitting)   Pulse (!) 52   Ht 5' 3.5" (1.613 m)   Wt 244 lb (110.7 kg)   SpO2 97%   BMI 42.54 kg/m  Body mass index is 42.54 kg/m. Wt Readings from Last 3 Encounters:  12/03/15 244 lb (110.7 kg)  09/02/15 281 lb (127.5 kg)  05/31/15 283 lb (128.4 kg)   Constitutional: obese, in NAD Eyes: PERRLA, EOMI, no exophthalmos ENT: moist mucous membranes, no thyromegaly, no cervical lymphadenopathy Cardiovascular: RRR, No MRG, no periankle edema Respiratory: CTA B Gastrointestinal:  abdomen soft, NT, ND, BS+ Musculoskeletal: no deformities, strength intact in all 4 Skin: moist, warm, no rashes Neurological: no tremor with outstretched hands, DTR normal in all 4  ASSESSMENT: 1. DM2, insulin-independent, uncontrolled, without complications Now status post gastric sleeve surgery - ? PN  2. Hypothyroidism  PLAN:  1. Patient with long-standing, uncontrolled diabetes, on oral antidiabetic regimen + basal insulin, now off all medicines except low-dose metformin, 500 mg daily. Her sugars started to increase, and they are in the 160s now. Therefore, will increase metformin to 500 mg twice a day and even thousand milligrams twice a day if needed. -  I suggested to:  Patient Instructions  Please increase Metformin to 500 mg 2x a day. You may need to increase to 1000 mg 2x a day if sugars are not at goal after the first increase.  Targets: - 80-130 (for you 80-115) before meals - <180 (for you <150) after meals  Please return in 3 months with your sugar log.   - continue checking sugars at different times of the day - check 2-3 times a day, rotating checks - UTD with eye exams - she will see another PCP at [facility] and will have annual labs next month - check hbA1c today >> 7.4% (higher) - Return to clinic in 3 mo  2. Hypothyroidism - on LT4 137 mcg daily - She stopped taking the levothyroxine after the gastric sleeve surgery, however, this was restarted 30 days later -  she has been on the above dose for 6 weeks now, however, she started taking Prilosec after the surgery and she takes it at the same time as the levothyroxine! I advised her that in this case, she is not absorbing much of the levothyroxine dose. Will move the Prilosec later in the day.  - I advised her to Take the thyroid hormone every day, with water, at least 30 minutes before breakfast, separated by at least 4 hours from: - acid reflux medications - calcium - iron - multivitamins - Will recheck TFTs in  5-6 weeks  Philemon Kingdom, MD PhD [facility]

## 2016-01-02 ENCOUNTER — Other Ambulatory Visit: Payer: Self-pay | Admitting: Physician Assistant

## 2016-01-02 DIAGNOSIS — Z23 Encounter for immunization: Secondary | ICD-10-CM | POA: Diagnosis not present

## 2016-01-02 DIAGNOSIS — Z1231 Encounter for screening mammogram for malignant neoplasm of breast: Secondary | ICD-10-CM

## 2016-01-02 DIAGNOSIS — Z1211 Encounter for screening for malignant neoplasm of colon: Secondary | ICD-10-CM | POA: Diagnosis not present

## 2016-01-02 DIAGNOSIS — E119 Type 2 diabetes mellitus without complications: Secondary | ICD-10-CM | POA: Diagnosis not present

## 2016-01-02 DIAGNOSIS — Z0000 Encounter for general adult medical examination without abnormal findings: Secondary | ICD-10-CM | POA: Diagnosis not present

## 2016-01-02 DIAGNOSIS — Z124 Encounter for screening for malignant neoplasm of cervix: Secondary | ICD-10-CM | POA: Diagnosis not present

## 2016-01-06 DIAGNOSIS — Z713 Dietary counseling and surveillance: Secondary | ICD-10-CM | POA: Diagnosis not present

## 2016-01-06 DIAGNOSIS — E569 Vitamin deficiency, unspecified: Secondary | ICD-10-CM | POA: Diagnosis not present

## 2016-01-14 ENCOUNTER — Other Ambulatory Visit: Payer: BLUE CROSS/BLUE SHIELD

## 2016-01-20 ENCOUNTER — Other Ambulatory Visit: Payer: Self-pay | Admitting: Internal Medicine

## 2016-01-21 DIAGNOSIS — Z1211 Encounter for screening for malignant neoplasm of colon: Secondary | ICD-10-CM | POA: Diagnosis not present

## 2016-01-21 DIAGNOSIS — D120 Benign neoplasm of cecum: Secondary | ICD-10-CM | POA: Diagnosis not present

## 2016-01-21 DIAGNOSIS — D125 Benign neoplasm of sigmoid colon: Secondary | ICD-10-CM | POA: Diagnosis not present

## 2016-02-10 ENCOUNTER — Encounter: Payer: Self-pay | Admitting: Internal Medicine

## 2016-02-10 NOTE — Progress Notes (Signed)
Received records from PCP. Labs drawn 01/02/2016: - HbA1c 7.2%, increased from 7.0% - ACR 14.1 - CBC with differential normal - Glucose 144, BUN/creatinine 16/0.72 - Lipids: 211/236/38/126 - TSH 0.065. Her dose of Synthroid was decreased from 137 to 125 g with plan to recheck labs in 6 weeks.

## 2016-02-11 ENCOUNTER — Ambulatory Visit
Admission: RE | Admit: 2016-02-11 | Discharge: 2016-02-11 | Disposition: A | Discharge status: Home / Self Care | Payer: BLUE CROSS/BLUE SHIELD | Source: Ambulatory Visit | Attending: Physician Assistant | Admitting: Physician Assistant

## 2016-02-11 DIAGNOSIS — Z1231 Encounter for screening mammogram for malignant neoplasm of breast: Secondary | ICD-10-CM | POA: Diagnosis not present

## 2016-02-13 DIAGNOSIS — J029 Acute pharyngitis, unspecified: Secondary | ICD-10-CM | POA: Diagnosis not present

## 2016-02-13 DIAGNOSIS — J039 Acute tonsillitis, unspecified: Secondary | ICD-10-CM | POA: Diagnosis not present

## 2016-03-04 ENCOUNTER — Encounter: Payer: Self-pay | Admitting: Internal Medicine

## 2016-03-04 ENCOUNTER — Ambulatory Visit (INDEPENDENT_AMBULATORY_CARE_PROVIDER_SITE_OTHER): Payer: BLUE CROSS/BLUE SHIELD | Admitting: Internal Medicine

## 2016-03-04 VITALS — BP 132/84 | HR 68 | Ht 64.0 in | Wt 213.0 lb

## 2016-03-04 DIAGNOSIS — E039 Hypothyroidism, unspecified: Secondary | ICD-10-CM

## 2016-03-04 DIAGNOSIS — Z794 Long term (current) use of insulin: Secondary | ICD-10-CM

## 2016-03-04 DIAGNOSIS — E1165 Type 2 diabetes mellitus with hyperglycemia: Secondary | ICD-10-CM | POA: Diagnosis not present

## 2016-03-04 MED ORDER — GLIMEPIRIDE 4 MG PO TABS: ORAL_TABLET | ORAL | 3 refills | Status: DC

## 2016-03-04 NOTE — Progress Notes (Signed)
Patient ID: Alicia Horne, female   DOB: August 21, 1965, 51 y.o.   MRN: [id number]  HPI: BRENDALIS Horne is a 51 y.o.-year-old female, returning for f/u for DM2, dx in 2001 (GDM 1998), insulin-dependent since 2015, uncontrolled, without complications. Last visit 3 mo ago.  New PCP: Silvio Pate, PA Insurance: [facility], Rx insurance: [employer].   DM 2: She had Gastric sleeve in 09/24/2015. She lost 70 lbs since then!  She stopped insulin, Jardiance, Amaryl, but we had to restart metformin at last visit >> now on 500 mg bid.  Last hemoglobin A1c was: Lab Results  Component Value Date   HGBA1C 7.4 12/03/2015   HGBA1C 7.0 09/03/2015   HGBA1C 6.4 05/31/2015   Pt was on a regimen of: - Metformin 1000 mg 2x a day (better tolerated after cholecystectomy 2013) - Amaryl 4 mg in am and 2 mg before dinner - Toujeo 50 units daily - started 07/28/2014 - Invokana 100 mg >> Jardiance 25 mg in am before breakfast  We stopped NPH 70 units in am and 40 units at bedtime Previously on Lantus (insurance coverage) 60 units bid, she felt better on this.  She tried Victoza >> nausea.  After the GBP: - Metformin 500 >> 500 mg bid - Amaryl 4 mg before B and D - started by herself 01/2016  Pt checks her sugars 2-3x a day and they are: - am: 110-133 >> 84-115 >> 91-121, 150 >> 80-94 >> 60x1, 80s >> 160s >> 130s - 2h after b'fast: n/c >> 124-136 >> n/c >> 132 >> <130 >> n/c - before lunch: n/c >> 109-132 >> n/c >> 119-141, 177 >> 110-115 >> 99-117 >> n/c >> 110 (130s before Amaryl) - 2h after lunch: 180-212 >> 124-136 >> 120-130s >> 132-137 >> <130 >> 110-141 >> n/c - before dinner: n/c >> 118-139 >> 125 >> 124-145 >> 110-115, 120 >> 90-120 >> n/c  - 2h after dinner: n/c >> 141, 156 >> n/c >> 110-124 >> <130 >> 128, 129 >> n/c - bedtime: n/c >> 138-148, 160 >> 145, 160 >> 132-148 >> 100-110 >> 120-142 >> n/c >> 145 - nighttime: n/c >> 131-182 >> see above No lows. Lowest sugar was 80's >> 110; she has  hypoglycemia awareness at 80.  Highest sugar was 160s >> 145.  Glucometer: ReliOn  Received records from PCP Silvio Pate, [place]). Labs drawn 01/02/2016: - HbA1c 7.2%, increased from 7.0% - ACR 14.1 - CBC with differential normal - Glucose 144, BUN/creatinine 16/0.72 - Lipids: 211/236/38/126 - TSH 0.065. Her dose of Synthroid was decreased from 137 to 125 g with plan to recheck labs in 6 weeks.  - no CKD, BUN/creatinine:  Lab Results  Component Value Date   BUN 20 08/30/2014   CREATININE 0.98 08/30/2014  On Lisinopril. - lipids: Lab Results  Component Value Date   CHOL 191 12/30/2013   HDL 42 12/30/2013   LDLCALC 127 (H) 12/30/2013   TRIG 111 12/30/2013   CHOLHDL 4.5 12/30/2013  On Pravastatin. - last eye exam was in 04/2015. No DR.  - + numbness and tingling in her feet.  Hypothyroidism:  - last tsh: 01/02/2016: TSH 0.065. Her dose of Synthroid was decreased from 137 to 125 g then. Lab Results  Component Value Date   TSH 2.11 03/07/2015   She takes 125 mcg LT4 daily. - in am - fasting - eats b'fast (shake) >30 min later - + PPI (Prilosec) - evening now - + MVI at lunch  She had a partial nephrectomy for renal cancer. She also has HTN, HL, fatty liver, hypothyroidism, h/o anemia.  ROS: Constitutional: +++ weight loss, no fatigue, no subjective hyperthermia Eyes: no blurry vision, no xerophthalmia ENT: no sore throat, no nodules palpated in throat, no dysphagia/odynophagia, no hoarseness Cardiovascular: no CP/SOB/palpitations/resolved leg swelling Respiratory: no cough/SOB Gastrointestinal: no N/V/D/C Musculoskeletal: no muscle/+ joint aches (shoulder) Skin: no rashes Neurological: no tremors/numbness/tingling/dizziness  I reviewed pt's medications, allergies, PMH, social hx, family hx, and changes were documented in the history of present illness. Otherwise, unchanged from my initial visit note:  Past Medical History:  Diagnosis Date  . Anemia   . Back  pain, chronic   . Diabetes mellitus   . Gallstones   . Hyperlipidemia   . Hypertension   . Hypothyroidism   . Morbid obesity ([place])   . Renal cell cancer ([place])   . Renal mass, left    Past Surgical History:  Procedure Laterality Date  . [place]  . CHOLECYSTECTOMY  07/03/2011   Procedure: LAPAROSCOPIC CHOLECYSTECTOMY WITH INTRAOPERATIVE CHOLANGIOGRAM;  Surgeon: Odis Hollingshead, MD;  Location: [facility];  Service: General;  Laterality: N/A;  . [place]  . TUBAL LIGATION  1998   History   Social History  . Marital Status: Married    Spouse Name: N/A  . Number of Children: 2   Occupational History  . A/R clerk   Social History Main Topics  . Smoking status: Never Smoker   . Smokeless tobacco: Not on file  . Alcohol Use: No  . Drug Use: No   Current Outpatient Prescriptions on File Prior to Visit  Medication Sig Dispense Refill  . glimepiride (AMARYL) 4 MG tablet TAKE ONE-HALF TABLET BY MOUTH TWICE DAILY BEFORE A MEAL 90 tablet 1  . Insulin Pen Needle (CAREFINE PEN NEEDLES) 32G X 4 MM MISC Use 2x a day 200 each 5  . levothyroxine (SYNTHROID, LEVOTHROID) 137 MCG tablet Take 1 tablet (137 mcg total) by mouth daily before breakfast. (Patient taking differently: Take 125 mcg by mouth daily before breakfast. ) 90 tablet 3  . metFORMIN (GLUCOPHAGE) 1000 MG tablet Take by mouth 1000 mg 2x a day with meals 180 tablet 1  . Multiple Vitamins-Minerals (MULTIVITAMIN WITH MINERALS) tablet Take 1 tablet by mouth daily.    . Insulin Glargine (TOUJEO SOLOSTAR) 300 UNIT/ML SOPN Inject 40 Units into the skin at bedtime. (Patient not taking: Reported on 03/04/2016) 18 pen 2   No current facility-administered medications on file prior to visit.    Allergies  Allergen Reactions  . Victoza [Liraglutide]   . Tape Other (See Comments)    Makes her skin burn   Family History  Problem Relation Age of Onset  . Hypertension Mother   . Hypertension  Father   . Diabetes Father    PE: BP 132/84 (BP Location: Left Arm, Patient Position: Sitting)   Pulse 68   Ht 5\' 4"  (1.626 m)   Wt 213 lb (96.6 kg)   SpO2 97%   BMI 36.56 kg/m  Body mass index is 36.56 kg/m. Wt Readings from Last 3 Encounters:  03/04/16 213 lb (96.6 kg)  12/03/15 244 lb (110.7 kg)  09/02/15 281 lb (127.5 kg)   Constitutional: obese, in NAD Eyes: PERRLA, EOMI, no exophthalmos ENT: moist mucous membranes, no thyromegaly, no cervical lymphadenopathy Cardiovascular: RRR, No MRG, no periankle edema Respiratory: CTA B Gastrointestinal: abdomen soft, NT, ND, BS+ Musculoskeletal: no deformities,  strength intact in all 4 Skin: moist, warm, no rashes Neurological: no tremor with outstretched hands, DTR normal in all 4  ASSESSMENT: 1. DM2, insulin-independent, uncontrolled, without complications Now status post gastric sleeve surgery - ? PN  2. Hypothyroidism  PLAN:  1. Patient with long-standing, uncontrolled diabetes, on oral antidiabetic regimen with half-max Metformin dose + added back Amaryl (off insulin after her GBP surgery last summer). She continues to lose weight!  We discussed that we will need to decrease and likely stop Amaryl soon. - received labs from PCP from 12/2015: 7.2% (slightly improved). - I suggested to:  Patient Instructions  Please increase Metformin at night to 1000 mg. Continue 500 mg in am. For now, continue Amaryl 4 mg before b'fast and dinner.  Please return in 3 months with your sugar log.   - continue checking sugars at different times of the day - check 2-3 times a day, rotating checks - UTD with eye exams - Return to clinic in 3 mo  2. Hypothyroidism - on LT4 125, decreased from 137 mcg daily 2 mo ago - she is now taking the LT4 correctly: every day, with water, at least 30 minutes before breakfast, separated by at least 4 hours from: - acid reflux medications (was taking PPIs along with LT4 in am at last visit >> moved these  at night) - calcium - iron - multivitamins - Will recheck TFTs today   Addendum (03/11/2015): Pt did not stop for labs >> will recheck them at next visit.  Philemon Kingdom, MD PhD [facility] End[facility]

## 2016-03-04 NOTE — Patient Instructions (Signed)
Please increase Metformin at night to 1000 mg. Continue 500 mg in am. For now, continue Amaryl 4 mg before b'fast and dinner.  Please return in 3 months with your sugar log.

## 2016-04-21 ENCOUNTER — Encounter: Payer: Self-pay | Admitting: Internal Medicine

## 2016-04-22 ENCOUNTER — Other Ambulatory Visit: Payer: Self-pay

## 2016-04-22 MED ORDER — LEVOTHYROXINE SODIUM 125 MCG PO TABS: ORAL_TABLET | ORAL | 1 refills | Status: DC

## 2016-06-02 ENCOUNTER — Encounter: Payer: Self-pay | Admitting: Internal Medicine

## 2016-06-02 ENCOUNTER — Ambulatory Visit (INDEPENDENT_AMBULATORY_CARE_PROVIDER_SITE_OTHER): Payer: BLUE CROSS/BLUE SHIELD | Admitting: Internal Medicine

## 2016-06-02 VITALS — BP 120/80 | HR 64 | Ht 63.0 in | Wt 192.0 lb

## 2016-06-02 DIAGNOSIS — E039 Hypothyroidism, unspecified: Secondary | ICD-10-CM

## 2016-06-02 DIAGNOSIS — E119 Type 2 diabetes mellitus without complications: Secondary | ICD-10-CM

## 2016-06-02 MED ORDER — METFORMIN HCL 1000 MG PO TABS: ORAL_TABLET | ORAL | 1 refills | Status: DC

## 2016-06-02 NOTE — Patient Instructions (Addendum)
Please stop Amaryl.  Continue: - Metformin 1000 mg 2x a day  Please stop at the lab.  Please return in 4 months with your sugar log.

## 2016-06-02 NOTE — Progress Notes (Signed)
Patient ID: Alicia Horne, female   DOB: April 01, 1965, 51 y.o.   MRN: [id number]  HPI: Alicia Horne is a 51 y.o.-year-old female, returning for f/u for DM2, dx in 2001 (GDM 1998), insulin-dependent since 2015, uncontrolled, without complications. Last visit 3 mo ago.  New PCP: Silvio Pate, PA Insurance: [facility], Rx insurance: [employer].   DM 2: She had Gastric sleeve in 09/24/2015. She lost 112 lbs since then!   Last hemoglobin A1c was: Lab Results  Component Value Date   HGBA1C 7.4 12/03/2015   HGBA1C 7.0 09/03/2015   HGBA1C 6.4 05/31/2015   Pt was on a regimen of: - Metformin 1000 mg 2x a day (better tolerated after cholecystectomy 2013) - Amaryl 4 mg in am and 2 mg before dinner - Toujeo 50 units daily - started 07/28/2014 - Invokana 100 mg >> Jardiance 25 mg in am before breakfast  We stopped NPH 70 units in am and 40 units at bedtime Previously on Lantus (insurance coverage) 60 units bid.  She tried Victoza >> nausea.  After the GBP: - Metformin 500 >> 1000 mg bid - Amaryl 4 mg before B and D   Pt checks her sugars 2x a day and they are low: - am: 91-121, 150 >> 80-94 >> 60x1, 80s >> 160s >> 130s >> 70-80 - 2h after b'fast: n/c >> 124-136 >> n/c >> 132 >> <130 >> n/c >> 98-99 - before lunch: 119-141, 177 >> 110-115 >> 99-117 >> n/c >> 110 (130s before Amaryl) - 2h after lunch: 124-136 >> 120-130s >> 132-137 >> <130 >> 110-141 >> n/c - before dinner: n/c >> 118-139 >> 125 >> 124-145 >> 110-115, 120 >> 90-120 >> n/c  - 2h after dinner: n/c >> 141, 156 >> n/c >> 110-124 >> <130 >> 128, 129 >> n/c - bedtime:  145, 160 >> 132-148 >> 100-110 >> 120-142 >> n/c >> 145 >> 98-99 - nighttime: n/c >> 131-182 >> see above No lows. Lowest sugar was 80's >> 65; she has hypoglycemia awareness at 80.  Highest sugar was 160s >> 145 >> 101  She is walking 5 mi a day.  Glucometer: ReliOn  Received records from PCP Silvio Pate, [place]). Labs drawn 01/02/2016: - HbA1c 7.2%,  increased from 7.0% - ACR 14.1 - CBC with differential normal - Glucose 144, BUN/creatinine 16/0.72 - Lipids: 211/236/38/126 - TSH 0.065. Her dose of Synthroid was decreased from 137 to 125 g  - no CKD, BUN/creatinine:  Lab Results  Component Value Date   BUN 20 08/30/2014   CREATININE 0.98 08/30/2014  On Lisinopril. - lipids: Lab Results  Component Value Date   CHOL 191 12/30/2013   HDL 42 12/30/2013   LDLCALC 127 (H) 12/30/2013   TRIG 111 12/30/2013   CHOLHDL 4.5 12/30/2013  On Pravastatin. - last eye exam was in 04/2015. No DR.  - + numbness and tingling in her feet.  Hypothyroidism:  - last TSH: Lab Results  Component Value Date   TSH 2.11 03/07/2015  01/02/2016: TSH 0.065. Her dose of Synthroid was decreased from 137 to 125 g then.  She takes 125 mcg LT4 daily. - in am - fasting - eats b'fast >30 min later - + PPI (Prilosec) - evening  - + MVI at lunch  She had a partial nephrectomy for renal cancer. She also has HTN, HL, fatty liver, hypothyroidism, h/o anemia.  ROS: Constitutional: +++ weight loss, no fatigue, no subjective hyperthermia Eyes: no blurry vision, no xerophthalmia ENT: no  sore throat, no nodules palpated in throat, no dysphagia/odynophagia, no hoarseness Cardiovascular: no CP/SOB/palpitations/resolved leg swelling Respiratory: no cough/SOB Gastrointestinal: no N/V/D/C Musculoskeletal: no muscle/+ joint aches (shoulders) Skin: no rashes Neurological: no tremors/numbness/tingling/dizziness  I reviewed pt's medications, allergies, PMH, social hx, family hx, and changes were documented in the history of present illness. Otherwise, unchanged from my initial visit note:  Past Medical History:  Diagnosis Date  . Anemia   . Back pain, chronic   . Diabetes mellitus   . Gallstones   . Hyperlipidemia   . Hypertension   . Hypothyroidism   . Morbid obesity ([place])   . Renal cell cancer ([place])   . Renal mass, left    Past Surgical History:   Procedure Laterality Date  . [place]  . CHOLECYSTECTOMY  07/03/2011   Procedure: LAPAROSCOPIC CHOLECYSTECTOMY WITH INTRAOPERATIVE CHOLANGIOGRAM;  Surgeon: Odis Hollingshead, MD;  Location: [facility];  Service: General;  Laterality: N/A;  . [place]  . TUBAL LIGATION  1998   History   Social History  . Marital Status: Married    Spouse Name: N/A  . Number of Children: 2   Occupational History  . A/R clerk   Social History Main Topics  . Smoking status: Never Smoker   . Smokeless tobacco: Not on file  . Alcohol Use: No  . Drug Use: No   Current Outpatient Prescriptions on File Prior to Visit  Medication Sig Dispense Refill  . glimepiride (AMARYL) 4 MG tablet TAKE 1 TABLET BY MOUTH TWICE DAILY BEFORE A MEAL 180 tablet 3  . Insulin Pen Needle (CAREFINE PEN NEEDLES) 32G X 4 MM MISC Use 2x a day 200 each 5  . levothyroxine (SYNTHROID, LEVOTHROID) 125 MCG tablet Please take one tablet daily 90 tablet 1  . metFORMIN (GLUCOPHAGE) 1000 MG tablet Take by mouth 1000 mg 2x a day with meals 180 tablet 1  . Multiple Vitamins-Minerals (MULTIVITAMIN WITH MINERALS) tablet Take 1 tablet by mouth daily.    . Insulin Glargine (TOUJEO SOLOSTAR) 300 UNIT/ML SOPN Inject 40 Units into the skin at bedtime. (Patient not taking: Reported on 03/04/2016) 18 pen 2   No current facility-administered medications on file prior to visit.    Allergies  Allergen Reactions  . Victoza [Liraglutide]   . Tape Other (See Comments)    Makes her skin burn   Family History  Problem Relation Age of Onset  . Hypertension Mother   . Hypertension Father   . Diabetes Father    PE: BP 120/80 (BP Location: Left Arm, Patient Position: Sitting)   Pulse 64   Ht 5\' 3"  (1.6 m)   Wt 192 lb (87.1 kg)   SpO2 97%   BMI 34.01 kg/m  Body mass index is 34.01 kg/m. Wt Readings from Last 3 Encounters:  06/02/16 192 lb (87.1 kg)  03/04/16 213 lb (96.6 kg)  12/03/15 244 lb (110.7  kg)   Constitutional: obese, in NAD Eyes: PERRLA, EOMI, no exophthalmos ENT: moist mucous membranes, no thyromegaly, no cervical lymphadenopathy Cardiovascular: RRR, No MRG, no periankle edema Respiratory: CTA B Gastrointestinal: abdomen soft, NT, ND, BS+ Musculoskeletal: no deformities, strength intact in all 4 Skin: moist, warm, no rashes Neurological: no tremor with outstretched hands, DTR normal in all 4  ASSESSMENT: 1. DM2, insulin-independent, uncontrolled, without complications Now status post gastric sleeve surgery - ? PN  2. Hypothyroidism  PLAN:  1. Patient with long-standing, uncontrolled diabetes, on oral antidiabetic regimen with  Metformin + Amaryl (off insulin after her GBP surgery last summer). She continues to lose weight and her sugars are now too low >> will stop Amaryl  We discussed that we will need to decrease Metformin at next visit. - received labs from PCP from 12/2015: 7.2% (slightly improved) >> today: MUCH improved: 5.4% - I suggested to:  Patient Instructions  Please stop Amaryl.  Continue: - Metformin 1000 mg 2x a day  Please return in 4 months with your sugar log.   - continue checking sugars at different times of the day - check once a day, rotating checks - UTD with eye exams - Return to clinic in 4 mo  2. Hypothyroidism - on LT4 125 mcg daily  - she is now taking the LT4 correctly: every day, with water, at least 30 minutes before breakfast, separated by at least 4 hours from: - acid reflux medications  - calcium - iron - multivitamins - Will recheck TFTs today   Component     Latest Ref Rng & Units 06/02/2016  TSH     0.35 - 4.50 uIU/mL 0.03 (L)  T4,Free(Direct)     0.60 - 1.60 ng/dL 1.28   Her levothyroxine dose is now too high. Will decrease the dose to 112 g daily and recheck the tests in 1.5 mo.  Philemon Kingdom, MD PhD [facility] Endocrinology

## 2016-06-03 LAB — T4, FREE: FREE T4: 1.28 ng/dL (ref 0.60–1.60)

## 2016-06-03 LAB — TSH: TSH: 0.03 u[IU]/mL — ABNORMAL LOW (ref 0.35–4.50)

## 2016-06-03 LAB — POCT GLYCOSYLATED HEMOGLOBIN (HGB A1C): Hemoglobin A1C: 5.4

## 2016-06-03 MED ORDER — LEVOTHYROXINE SODIUM 112 MCG PO TABS: 112.0000 ug | ORAL_TABLET | Freq: Every day | ORAL | 3 refills | Status: DC

## 2016-10-02 ENCOUNTER — Ambulatory Visit: Payer: BLUE CROSS/BLUE SHIELD | Admitting: Internal Medicine

## 2016-10-02 ENCOUNTER — Ambulatory Visit (INDEPENDENT_AMBULATORY_CARE_PROVIDER_SITE_OTHER): Payer: BLUE CROSS/BLUE SHIELD | Admitting: Internal Medicine

## 2016-10-02 ENCOUNTER — Encounter: Payer: Self-pay | Admitting: Internal Medicine

## 2016-10-02 VITALS — BP 122/70 | HR 63 | Wt 171.6 lb

## 2016-10-02 DIAGNOSIS — E119 Type 2 diabetes mellitus without complications: Secondary | ICD-10-CM | POA: Diagnosis not present

## 2016-10-02 DIAGNOSIS — E039 Hypothyroidism, unspecified: Secondary | ICD-10-CM

## 2016-10-02 LAB — T4, FREE: Free T4: 1.35 ng/dL (ref 0.60–1.60)

## 2016-10-02 LAB — POCT GLYCOSYLATED HEMOGLOBIN (HGB A1C): Hemoglobin A1C: 5.7

## 2016-10-02 LAB — TSH: TSH: 0.13 u[IU]/mL — AB (ref 0.35–4.50)

## 2016-10-02 MED ORDER — METFORMIN HCL 1000 MG PO TABS: ORAL_TABLET | ORAL | 3 refills | Status: DC

## 2016-10-02 NOTE — Progress Notes (Signed)
Patient ID: Alicia Horne, female   DOB: Sep 23, 1965, 51 y.o.   MRN: [id number]  HPI: Alicia Horne is a 51 y.o.-year-old female, returning for f/u for DM2, dx in 2001 (GDM 1998), insulin-dependent since 2015, uncontrolled, without complications. Last visit 4 mo ago. New PCP: Silvio Pate, PA Insurance: [facility], Rx insurance: [employer].   DM 2: She had Gastric sleeve in 09/24/2015. She lost ~130 lbs since then! She feels great and has a lot of energy!  Last hemoglobin A1c was: Lab Results  Component Value Date   HGBA1C 5.4 06/03/2016   HGBA1C 7.4 12/03/2015   HGBA1C 7.0 09/03/2015   Pt was on a regimen of: - Metformin 1000 mg 2x a day (better tolerated after cholecystectomy 2013) - Amaryl 4 mg in am and 2 mg before dinner - Toujeo 50 units daily - started 07/28/2014 - Invokana 100 mg >> Jardiance 25 mg in am before breakfast  We stopped NPH 70 units in am and 40 units at bedtime Previously on Lantus (insurance coverage) 60 units bid.  She tried Victoza >> nausea.  After the GBP: - Metformin 500 >> 1000 mg bid We stopped Amaryl 4 mg before B and D in 05/2016  Pt checks her sugars 1x a day: - am:  160s >> 130s >> 70-80 >> 110 - 2h after b'fast:  <130 >> n/c >> 98-99 >> n/c - before lunch:  n/c >> 110 (130s before Amaryl) >> 98-100 - 2h after lunch: <130 >> 110-141 >> n/c >> 117 - before dinner: 110-115, 120 >> 90-120 >> n/c  - 2h after dinner:  <130 >> 128, 129 >> n/c - bedtime120-142 >> n/c >> 145 >> 98-99 >> 90-130 - nighttime: n/c >> 131-182 >> see above No lows. Lowest sugar was 80's >> 65 >> 85; she has hypoglycemia awareness at 80.  Highest sugar was 160s >> 145 >> 101 >> 130.  She is walking 5 mi a day. She started to go to the gym 3x a week  Glucometer: ReliOn  Received records from PCP Claiborne Billings [place], [place]). Labs drawn 01/02/2016: - HbA1c 7.2%, increased from 7.0% - ACR 14.1 - CBC with differential normal - Glucose 144, BUN/creatinine 16/0.72 - Lipids:  211/236/38/126 - TSH 0.065. Her dose of Synthroid was decreased from 137 to 125 g  - No CKD, BUN/creatinine:  Lab Results  Component Value Date   BUN 20 08/30/2014   CREATININE 0.98 08/30/2014  Off Lisinopril. - lipids: Lab Results  Component Value Date   CHOL 191 12/30/2013   HDL 42 12/30/2013   LDLCALC 127 (H) 12/30/2013   TRIG 111 12/30/2013   CHOLHDL 4.5 12/30/2013  Off Pravastatin. - last eye exam was in 04/2015 >> No DR - she has numbness and tingling in her feet.  Hypothyroidism:  - last TSH: Lab Results  Component Value Date   TSH 0.03 (L) 06/02/2016   TSH 2.11 03/07/2015   TSH 2.19 08/30/2014   TSH 2.293 12/30/2013   TSH 8.346 (H) 03/28/2013  01/02/2016: TSH 0.065.   Pt is on levothyroxine 112 mcg daily (dose decreased 05/2016 >> did not return for recheck labs...), taken: - in am - fasting - at least 30 min from b'fast - no Ca, Fe, PPIs - + MVI >4h later - not on Biotin  She had a partial nephrectomy for renal cancer. She also has HTN, HL, fatty liver, hypothyroidism, h/o anemia.  ROS: Constitutional: + weight loss, no fatigue, no subjective hyperthermia, no subjective hypothermia  Eyes: no blurry vision, no xerophthalmia ENT: no sore throat, no nodules palpated in throat, no dysphagia, no odynophagia, no hoarseness Cardiovascular: no CP/no SOB/no palpitations/no leg swelling Respiratory: no cough/no SOB/no wheezing Gastrointestinal: no N/no V/no D/no C/no acid reflux Musculoskeletal: no muscle aches/no joint aches Skin: no rashes, no hair loss Neurological: no tremors/no dizziness  I reviewed pt's medications, allergies, PMH, social hx, family hx, and changes were documented in the history of present illness. Otherwise, unchanged from my initial visit note.  Past Medical History:  Diagnosis Date  . Anemia   . Back pain, chronic   . Diabetes mellitus   . Gallstones   . Hyperlipidemia   . Hypertension   . Hypothyroidism   . Morbid obesity ([place])    . Renal cell cancer ([place])   . Renal mass, left    Past Surgical History:  Procedure Laterality Date  . [place]  . CHOLECYSTECTOMY  07/03/2011   Procedure: LAPAROSCOPIC CHOLECYSTECTOMY WITH INTRAOPERATIVE CHOLANGIOGRAM;  Surgeon: Odis Hollingshead, MD;  Location: [facility];  Service: General;  Laterality: N/A;  . [place]  . TUBAL LIGATION  1998   History   Social History  . Marital Status: Married    Spouse Name: N/A  . Number of Children: 2   Occupational History  . A/R clerk   Social History Main Topics  . Smoking status: Never Smoker   . Smokeless tobacco: Not on file  . Alcohol Use: No  . Drug Use: No   Current Outpatient Prescriptions on File Prior to Visit  Medication Sig Dispense Refill  . levothyroxine (SYNTHROID, LEVOTHROID) 112 MCG tablet Take 1 tablet (112 mcg total) by mouth daily. 45 tablet 3  . metFORMIN (GLUCOPHAGE) 1000 MG tablet Take by mouth 1000 mg 2x a day with meals 180 tablet 1  . Multiple Vitamins-Minerals (MULTIVITAMIN WITH MINERALS) tablet Take 1 tablet by mouth daily.     No current facility-administered medications on file prior to visit.    Allergies  Allergen Reactions  . Victoza [Liraglutide]   . Tape Other (See Comments)    Makes her skin burn   Family History  Problem Relation Age of Onset  . Hypertension Mother   . Hypertension Father   . Diabetes Father    PE: BP 122/70   Pulse 63   Wt 171 lb 9.6 oz (77.8 kg)   SpO2 98%   BMI 30.40 kg/m  Body mass index is 30.4 kg/m. Wt Readings from Last 3 Encounters:  10/02/16 171 lb 9.6 oz (77.8 kg)  06/02/16 192 lb (87.1 kg)  03/04/16 213 lb (96.6 kg)   Constitutional: overweight, in NAD Eyes: PERRLA, EOMI, no exophthalmos ENT: moist mucous membranes, no thyromegaly, no cervical lymphadenopathy Cardiovascular: RRR, No MRG Respiratory: CTA B Gastrointestinal: abdomen soft, NT, ND, BS+ Musculoskeletal: no deformities, strength intact in  all 4 Skin: moist, warm, no rashes Neurological: no tremor with outstretched hands, DTR normal in all 4  ASSESSMENT: 1. DM2, insulin-independent, uncontrolled, without complications Now status post gastric sleeve surgery - ? PN  2. Hypothyroidism  PLAN:  1. Patient with long-standing, Previously uncontrolled diabetes, now with dramatic improvement in diabetes control after her gastric sleeve surgery and subsequent weight loss of a total of approximately 130 pounds. She was able to come off more than 100 units of insulin and most of her oral medicines. She is currently on metformin 1000 mg twice a day, and we discussed about reducing  the dose to 500 mg twice a day. However, for now, she decided to stay on the same dose and reevaluate at next visit. She is tolerating the medication well and has no low blood sugars.  - I suggested to:  Patient Instructions  Please continue: - Metformin 1000 mg 2x a day  Please return in 6 months with your sugar log.   - today, HbA1c is 5.7%  - continue checking sugars at different times of the day - check 1x a day, rotating checks - advised for yearly eye exams >> she needs one >> Will call to schedule - she will have annual labs by PCP in 12/2016 - Return to clinic in 6 mo with sugar log   2. Hypothyroidism - latest thyroid labs reviewed with pt >> TSH suppressed >> we decreased LT4 dose to 112 mcg daily then. She did not return for repeat labs. Will check them today. - pt feels good on this dose. - we discussed about taking the thyroid hormone every day, with water, >30 minutes before breakfast, separated by >4 hours from acid reflux medications, calcium, iron, multivitamins. Pt. is taking it correctly - will check thyroid tests today: TSH and fT4 - If labs are abnormal, she will need to return for repeat TFTs in 1.5 months - OTW, RTC in 6 mo  Component     Latest Ref Rng & Units 10/02/2016  Hemoglobin A1C      5.7  TSH     0.35 - 4.50 uIU/mL  0.13 (L)  T4,Free(Direct)     0.60 - 1.60 ng/dL 1.35   TSH still suppressed >> will decrease LT4 further, to 100 mcg daily and repeat labs in 6 weeks. Philemon Kingdom, MD PhD [facility]

## 2016-10-02 NOTE — Patient Instructions (Addendum)
Please continue: - Metformin 1000 mg 2x a day  Please continue: - Levothyroxine 112 mcg daily  Take the thyroid hormone every day, with water, at least 30 minutes before breakfast, separated by at least 4 hours from: - acid reflux medications - calcium - iron - multivitamins  Please stop at the lab.  Please return in 6 months with your sugar log.

## 2016-10-05 ENCOUNTER — Encounter: Payer: Self-pay | Admitting: Internal Medicine

## 2016-10-05 ENCOUNTER — Other Ambulatory Visit: Payer: Self-pay | Admitting: Internal Medicine

## 2016-10-05 DIAGNOSIS — E119 Type 2 diabetes mellitus without complications: Secondary | ICD-10-CM

## 2016-10-05 MED ORDER — LEVOTHYROXINE SODIUM 100 MCG PO TABS: 100.0000 ug | ORAL_TABLET | Freq: Every day | ORAL | 5 refills | Status: DC

## 2016-10-09 ENCOUNTER — Other Ambulatory Visit (INDEPENDENT_AMBULATORY_CARE_PROVIDER_SITE_OTHER): Payer: BLUE CROSS/BLUE SHIELD

## 2016-10-09 DIAGNOSIS — E119 Type 2 diabetes mellitus without complications: Secondary | ICD-10-CM | POA: Diagnosis not present

## 2016-10-09 DIAGNOSIS — E039 Hypothyroidism, unspecified: Secondary | ICD-10-CM | POA: Diagnosis not present

## 2016-10-09 LAB — TSH: TSH: 0.08 u[IU]/mL — AB (ref 0.35–4.50)

## 2016-10-09 LAB — T4, FREE: FREE T4: 1.15 ng/dL (ref 0.60–1.60)

## 2016-10-13 ENCOUNTER — Encounter: Payer: Self-pay | Admitting: Internal Medicine

## 2016-10-13 ENCOUNTER — Other Ambulatory Visit: Payer: Self-pay | Admitting: Internal Medicine

## 2016-10-13 DIAGNOSIS — E039 Hypothyroidism, unspecified: Secondary | ICD-10-CM

## 2016-10-14 MED ORDER — LEVOTHYROXINE SODIUM 100 MCG PO TABS: 100.0000 ug | ORAL_TABLET | Freq: Every day | ORAL | 5 refills | Status: DC

## 2017-01-22 ENCOUNTER — Encounter: Payer: Self-pay | Admitting: Internal Medicine

## 2017-01-22 NOTE — Progress Notes (Signed)
Received labs drawn on 01/19/2017: -HbA1c 6.0% -Lipids: 191/130/56/109, improved from 01/02/2016: 211/236/38/126 -CMP: Glucose 96, BUN/creatinine 17/0.72, the rest normal -ACR: less than 4.6 -Vitamin B12 1939 -TSH 1.65, free T4 1.33 -Vitamin D 41.9

## 2017-04-09 ENCOUNTER — Ambulatory Visit: Payer: BLUE CROSS/BLUE SHIELD | Admitting: Internal Medicine

## 2017-04-12 ENCOUNTER — Telehealth: Payer: Self-pay | Admitting: Internal Medicine

## 2017-04-12 NOTE — Telephone Encounter (Signed)
Please advise on below  

## 2017-04-12 NOTE — Telephone Encounter (Signed)
ok 

## 2017-04-12 NOTE — Telephone Encounter (Signed)
Alicia Horne at pharmacy is aware

## 2017-04-12 NOTE — Telephone Encounter (Signed)
Alicia Horne calling from [facility] concerning medication Leveothyroxine want to know if they can change manufacturers. Please advise  [phone]

## 2017-04-14 DIAGNOSIS — I10 Essential (primary) hypertension: Secondary | ICD-10-CM | POA: Diagnosis not present

## 2017-04-14 DIAGNOSIS — S8262XA Displaced fracture of lateral malleolus of left fibula, initial encounter for closed fracture: Secondary | ICD-10-CM | POA: Diagnosis not present

## 2017-04-14 DIAGNOSIS — E119 Type 2 diabetes mellitus without complications: Secondary | ICD-10-CM | POA: Diagnosis not present

## 2017-04-14 DIAGNOSIS — Z7984 Long term (current) use of oral hypoglycemic drugs: Secondary | ICD-10-CM | POA: Diagnosis not present

## 2017-04-14 DIAGNOSIS — S82492A Other fracture of shaft of left fibula, initial encounter for closed fracture: Secondary | ICD-10-CM | POA: Diagnosis not present

## 2017-04-14 DIAGNOSIS — Z79899 Other long term (current) drug therapy: Secondary | ICD-10-CM | POA: Diagnosis not present

## 2017-04-15 DIAGNOSIS — S8265XA Nondisplaced fracture of lateral malleolus of left fibula, initial encounter for closed fracture: Secondary | ICD-10-CM | POA: Diagnosis not present

## 2017-04-16 NOTE — H&P (Signed)
PREOPERATIVE H&P  Chief Complaint: LEFT FIBULA FRACTURE  HPI: Alicia Horne is a 52 y.o. female who presents for preoperative history and physical with a diagnosis of LEFT FIBULA FRACTURE. Symptoms are rated as moderate to severe, and have been worsening.  This is significantly impairing activities of daily living.  She has elected for surgical management.   Past Medical History:  Diagnosis Date  . Anemia   . Back pain, chronic   . Diabetes mellitus   . Gallstones   . Hyperlipidemia   . Hypertension   . Hypothyroidism   . Morbid obesity ([place])   . Renal cell cancer ([place])   . Renal mass, left    Past Surgical History:  Procedure Laterality Date  . [place]  . CHOLECYSTECTOMY  07/03/2011   Procedure: LAPAROSCOPIC CHOLECYSTECTOMY WITH INTRAOPERATIVE CHOLANGIOGRAM;  Surgeon: Odis Hollingshead, MD;  Location: [facility];  Service: General;  Laterality: N/A;  . [place]  . TUBAL LIGATION  1998   Social History   Socioeconomic History  . Marital status: Married    Spouse name: Not on file  . Number of children: Not on file  . Years of education: Not on file  . Highest education level: Not on file  Social Needs  . Financial resource strain: Not on file  . Food insecurity - worry: Not on file  . Food insecurity - inability: Not on file  . Transportation needs - medical: Not on file  . Transportation needs - non-medical: Not on file  Occupational History  . Not on file  Tobacco Use  . Smoking status: Never Smoker  . Smokeless tobacco: Never Used  Substance and Sexual Activity  . Alcohol use: No  . Drug use: No  . Sexual activity: Yes  Other Topics Concern  . Not on file  Social History Narrative  . Not on file   Family History  Problem Relation Age of Onset  . Hypertension Mother   . Hypertension Father   . Diabetes Father    Allergies  Allergen Reactions  . Victoza [Liraglutide]   . Tape Other (See Comments)    Makes  her skin burn   Prior to Admission medications   Medication Sig Start Date End Date Taking? Authorizing Provider  levothyroxine (SYNTHROID, LEVOTHROID) 100 MCG tablet Take 1 tablet (100 mcg total) by mouth daily. 10/14/16   Philemon Kingdom, MD  metFORMIN (GLUCOPHAGE) 1000 MG tablet Take by mouth 1000 mg 2x a day with meals 10/02/16   Philemon Kingdom, MD  Multiple Vitamins-Minerals (MULTIVITAMIN WITH MINERALS) tablet Take 1 tablet by mouth daily.    [provider]     Positive ROS: All other systems have been reviewed and were otherwise negative with the exception of those mentioned in the HPI and as above.  Physical Exam: General: Alert, no acute distress Cardiovascular: No pedal edema Respiratory: No cyanosis, no use of accessory musculature GI: No organomegaly, abdomen is soft and non-tender Skin: No lesions in the area of chief complaint Neurologic: Sensation intact distally Psychiatric: Patient is competent for consent with normal mood and affect Lymphatic: No axillary or cervical lymphadenopathy  MUSCULOSKELETAL: L ankle: wiggles toes, wwp, ttp lateral an  Assessment: LEFT FIBULA FRACTURE  Plan: Plan for Procedure(s): OPEN REDUCTION INTERNAL FIXATION (ORIF) LEFT FIBULA FRACTURE  The risks benefits and alternatives were discussed with the patient including but not limited to the risks of nonoperative treatment, versus surgical intervention including infection, bleeding,  nerve injury,  blood clots, cardiopulmonary complications, morbidity, mortality, among others, and they were willing to proceed.   Hiram Gash, MD  04/16/2017 7:53 PM

## 2017-04-19 ENCOUNTER — Other Ambulatory Visit: Payer: Self-pay

## 2017-04-19 ENCOUNTER — Encounter (HOSPITAL_BASED_OUTPATIENT_CLINIC_OR_DEPARTMENT_OTHER): Payer: Self-pay | Admitting: *Deleted

## 2017-04-19 DIAGNOSIS — S8265XD Nondisplaced fracture of lateral malleolus of left fibula, subsequent encounter for closed fracture with routine healing: Secondary | ICD-10-CM | POA: Diagnosis not present

## 2017-04-21 ENCOUNTER — Encounter (HOSPITAL_BASED_OUTPATIENT_CLINIC_OR_DEPARTMENT_OTHER)
Admission: RE | Admit: 2017-04-21 | Discharge: 2017-04-21 | Disposition: A | Discharge status: Home / Self Care | Payer: BLUE CROSS/BLUE SHIELD | Source: Ambulatory Visit | Attending: Orthopaedic Surgery | Admitting: Orthopaedic Surgery

## 2017-04-21 DIAGNOSIS — Y929 Unspecified place or not applicable: Secondary | ICD-10-CM | POA: Diagnosis not present

## 2017-04-21 DIAGNOSIS — E785 Hyperlipidemia, unspecified: Secondary | ICD-10-CM | POA: Diagnosis not present

## 2017-04-21 DIAGNOSIS — G8929 Other chronic pain: Secondary | ICD-10-CM | POA: Diagnosis not present

## 2017-04-21 DIAGNOSIS — X58XXXA Exposure to other specified factors, initial encounter: Secondary | ICD-10-CM | POA: Diagnosis not present

## 2017-04-21 DIAGNOSIS — M549 Dorsalgia, unspecified: Secondary | ICD-10-CM | POA: Diagnosis not present

## 2017-04-21 DIAGNOSIS — Z7984 Long term (current) use of oral hypoglycemic drugs: Secondary | ICD-10-CM | POA: Diagnosis not present

## 2017-04-21 DIAGNOSIS — Z888 Allergy status to other drugs, medicaments and biological substances status: Secondary | ICD-10-CM | POA: Diagnosis not present

## 2017-04-21 DIAGNOSIS — I10 Essential (primary) hypertension: Secondary | ICD-10-CM | POA: Insufficient documentation

## 2017-04-21 DIAGNOSIS — E039 Hypothyroidism, unspecified: Secondary | ICD-10-CM | POA: Diagnosis not present

## 2017-04-21 DIAGNOSIS — E119 Type 2 diabetes mellitus without complications: Secondary | ICD-10-CM | POA: Diagnosis not present

## 2017-04-21 DIAGNOSIS — S82432A Displaced oblique fracture of shaft of left fibula, initial encounter for closed fracture: Secondary | ICD-10-CM | POA: Diagnosis not present

## 2017-04-21 DIAGNOSIS — Z85528 Personal history of other malignant neoplasm of kidney: Secondary | ICD-10-CM | POA: Diagnosis not present

## 2017-04-21 LAB — BASIC METABOLIC PANEL
Anion gap: 10 (ref 5–15)
BUN: 27 mg/dL — ABNORMAL HIGH (ref 6–20)
CALCIUM: 9.4 mg/dL (ref 8.9–10.3)
CHLORIDE: 105 mmol/L (ref 101–111)
CO2: 25 mmol/L (ref 22–32)
CREATININE: 0.71 mg/dL (ref 0.44–1.00)
GFR calc Af Amer: >60 mL/min (ref >60)
GFR calc non Af Amer: >60 mL/min (ref >60)
GLUCOSE: 115 mg/dL — AB (ref 65–99)
Potassium: 4.3 mmol/L (ref 3.5–5.1)
Sodium: 140 mmol/L (ref 135–145)

## 2017-04-22 ENCOUNTER — Ambulatory Visit (HOSPITAL_BASED_OUTPATIENT_CLINIC_OR_DEPARTMENT_OTHER): Payer: BLUE CROSS/BLUE SHIELD | Admitting: Certified Registered"

## 2017-04-22 ENCOUNTER — Encounter (HOSPITAL_BASED_OUTPATIENT_CLINIC_OR_DEPARTMENT_OTHER): Payer: Self-pay

## 2017-04-22 ENCOUNTER — Ambulatory Visit (HOSPITAL_BASED_OUTPATIENT_CLINIC_OR_DEPARTMENT_OTHER)
Admission: RE | Admit: 2017-04-22 | Discharge: 2017-04-22 | Disposition: A | Discharge status: Home / Self Care | Payer: BLUE CROSS/BLUE SHIELD | Source: Ambulatory Visit | Attending: Orthopaedic Surgery | Admitting: Orthopaedic Surgery

## 2017-04-22 ENCOUNTER — Encounter (HOSPITAL_BASED_OUTPATIENT_CLINIC_OR_DEPARTMENT_OTHER)
Admission: RE | Disposition: A | Discharge status: Home / Self Care | Payer: Self-pay | Source: Ambulatory Visit | Attending: Orthopaedic Surgery

## 2017-04-22 ENCOUNTER — Other Ambulatory Visit: Payer: Self-pay

## 2017-04-22 DIAGNOSIS — Z888 Allergy status to other drugs, medicaments and biological substances status: Secondary | ICD-10-CM | POA: Diagnosis not present

## 2017-04-22 DIAGNOSIS — Z7984 Long term (current) use of oral hypoglycemic drugs: Secondary | ICD-10-CM | POA: Diagnosis not present

## 2017-04-22 DIAGNOSIS — Z85528 Personal history of other malignant neoplasm of kidney: Secondary | ICD-10-CM | POA: Diagnosis not present

## 2017-04-22 DIAGNOSIS — S8262XA Displaced fracture of lateral malleolus of left fibula, initial encounter for closed fracture: Secondary | ICD-10-CM | POA: Diagnosis not present

## 2017-04-22 DIAGNOSIS — G8918 Other acute postprocedural pain: Secondary | ICD-10-CM | POA: Diagnosis not present

## 2017-04-22 DIAGNOSIS — E039 Hypothyroidism, unspecified: Secondary | ICD-10-CM | POA: Diagnosis not present

## 2017-04-22 DIAGNOSIS — G8929 Other chronic pain: Secondary | ICD-10-CM | POA: Diagnosis not present

## 2017-04-22 DIAGNOSIS — S82432A Displaced oblique fracture of shaft of left fibula, initial encounter for closed fracture: Secondary | ICD-10-CM | POA: Diagnosis not present

## 2017-04-22 DIAGNOSIS — E119 Type 2 diabetes mellitus without complications: Secondary | ICD-10-CM | POA: Diagnosis not present

## 2017-04-22 DIAGNOSIS — M549 Dorsalgia, unspecified: Secondary | ICD-10-CM | POA: Diagnosis not present

## 2017-04-22 DIAGNOSIS — E785 Hyperlipidemia, unspecified: Secondary | ICD-10-CM | POA: Diagnosis not present

## 2017-04-22 DIAGNOSIS — I10 Essential (primary) hypertension: Secondary | ICD-10-CM | POA: Diagnosis not present

## 2017-04-22 HISTORY — PX: ORIF FIBULA FRACTURE: SHX5114

## 2017-04-22 HISTORY — DX: Unspecified fracture of shaft of unspecified fibula, initial encounter for closed fracture: S82.409A

## 2017-04-22 LAB — GLUCOSE, CAPILLARY
GLUCOSE-CAPILLARY: 112 mg/dL — AB (ref 65–99)
Glucose-Capillary: 186 mg/dL — ABNORMAL HIGH (ref 65–99)

## 2017-04-22 SURGERY — OPEN REDUCTION INTERNAL FIXATION (ORIF) FIBULA FRACTURE
Anesthesia: General | Site: Ankle | Laterality: Left

## 2017-04-22 MED ORDER — ONDANSETRON HCL 4 MG PO TABS: 4.0000 mg | ORAL_TABLET | Freq: Three times a day (TID) | ORAL | 1 refills | Status: AC | PRN

## 2017-04-22 MED ORDER — PROPOFOL 10 MG/ML IV BOLUS
INTRAVENOUS | Status: DC | PRN
  Administered 2017-04-22: 150 mg via INTRAVENOUS

## 2017-04-22 MED ORDER — LIDOCAINE 2% (20 MG/ML) 5 ML SYRINGE
INTRAMUSCULAR | Status: AC
  Filled 2017-04-22: qty 5

## 2017-04-22 MED ORDER — DEXAMETHASONE SODIUM PHOSPHATE 10 MG/ML IJ SOLN
INTRAMUSCULAR | Status: DC | PRN
  Administered 2017-04-22: 10 mg via INTRAVENOUS

## 2017-04-22 MED ORDER — ACETAMINOPHEN 500 MG PO TABS: 1000.0000 mg | ORAL_TABLET | Freq: Three times a day (TID) | ORAL | 0 refills | Status: AC

## 2017-04-22 MED ORDER — ONDANSETRON HCL 4 MG/2ML IJ SOLN
INTRAMUSCULAR | Status: DC | PRN
  Administered 2017-04-22: 4 mg via INTRAVENOUS

## 2017-04-22 MED ORDER — EPINEPHRINE 30 MG/30ML IJ SOLN
INTRAMUSCULAR | Status: AC
  Filled 2017-04-22: qty 1

## 2017-04-22 MED ORDER — CHLORHEXIDINE GLUCONATE 4 % EX LIQD: 60.0000 mL | Freq: Once | CUTANEOUS | Status: DC

## 2017-04-22 MED ORDER — MIDAZOLAM HCL 2 MG/2ML IJ SOLN
1.0000 mg | INTRAMUSCULAR | Status: DC | PRN
  Administered 2017-04-22: 2 mg via INTRAVENOUS

## 2017-04-22 MED ORDER — ONDANSETRON HCL 4 MG/2ML IJ SOLN
INTRAMUSCULAR | Status: AC
  Filled 2017-04-22: qty 2

## 2017-04-22 MED ORDER — BUPIVACAINE-EPINEPHRINE (PF) 0.5% -1:200000 IJ SOLN
INTRAMUSCULAR | Status: DC | PRN
  Administered 2017-04-22: 30 mL via PERINEURAL

## 2017-04-22 MED ORDER — LACTATED RINGERS IV SOLN
INTRAVENOUS | Status: DC
  Administered 2017-04-22 (×2): via INTRAVENOUS

## 2017-04-22 MED ORDER — CEFAZOLIN SODIUM-DEXTROSE 2-3 GM-%(50ML) IV SOLR
INTRAVENOUS | Status: DC | PRN
  Administered 2017-04-22: 2 g via INTRAVENOUS

## 2017-04-22 MED ORDER — CEFAZOLIN SODIUM-DEXTROSE 2-4 GM/100ML-% IV SOLN
INTRAVENOUS | Status: AC
  Filled 2017-04-22: qty 100

## 2017-04-22 MED ORDER — MIDAZOLAM HCL 2 MG/2ML IJ SOLN
INTRAMUSCULAR | Status: AC
  Filled 2017-04-22: qty 2

## 2017-04-22 MED ORDER — FENTANYL CITRATE (PF) 100 MCG/2ML IJ SOLN
50.0000 ug | INTRAMUSCULAR | Status: DC | PRN
  Administered 2017-04-22: 50 ug via INTRAVENOUS
  Administered 2017-04-22: 25 ug via INTRAVENOUS

## 2017-04-22 MED ORDER — CEFAZOLIN SODIUM-DEXTROSE 2-4 GM/100ML-% IV SOLN: 2.0000 g | INTRAVENOUS | Status: DC

## 2017-04-22 MED ORDER — MELOXICAM 7.5 MG PO TABS: 7.5000 mg | ORAL_TABLET | Freq: Every day | ORAL | 2 refills | Status: AC

## 2017-04-22 MED ORDER — VANCOMYCIN HCL 500 MG IV SOLR
INTRAVENOUS | Status: DC | PRN
  Administered 2017-04-22: 500 mg via TOPICAL

## 2017-04-22 MED ORDER — BUPIVACAINE HCL (PF) 0.25 % IJ SOLN
INTRAMUSCULAR | Status: AC
  Filled 2017-04-22: qty 60

## 2017-04-22 MED ORDER — OMEPRAZOLE 20 MG PO CPDR: 20.0000 mg | DELAYED_RELEASE_CAPSULE | Freq: Every day | ORAL | 0 refills | Status: DC

## 2017-04-22 MED ORDER — ASPIRIN 81 MG PO TABS: 81.0000 mg | ORAL_TABLET | Freq: Every day | ORAL | 0 refills | Status: AC

## 2017-04-22 MED ORDER — LIDOCAINE HCL (CARDIAC) 20 MG/ML IV SOLN
INTRAVENOUS | Status: DC | PRN
  Administered 2017-04-22: 80 mg via INTRAVENOUS

## 2017-04-22 MED ORDER — OXYCODONE HCL 5 MG PO TABS: ORAL_TABLET | ORAL | 0 refills | Status: AC

## 2017-04-22 MED ORDER — SCOPOLAMINE 1 MG/3DAYS TD PT72: 1.0000 | MEDICATED_PATCH | Freq: Once | TRANSDERMAL | Status: DC | PRN

## 2017-04-22 MED ORDER — PROPOFOL 10 MG/ML IV BOLUS
INTRAVENOUS | Status: AC
Start: 2017-04-22 — End: 2017-04-22
  Filled 2017-04-22: qty 40

## 2017-04-22 MED ORDER — FENTANYL CITRATE (PF) 100 MCG/2ML IJ SOLN
INTRAMUSCULAR | Status: AC
  Filled 2017-04-22: qty 2

## 2017-04-22 MED ORDER — ROPIVACAINE HCL 7.5 MG/ML IJ SOLN
INTRAMUSCULAR | Status: DC | PRN
  Administered 2017-04-22: 20 mL via PERINEURAL

## 2017-04-22 MED ORDER — DEXAMETHASONE SODIUM PHOSPHATE 10 MG/ML IJ SOLN
INTRAMUSCULAR | Status: AC
  Filled 2017-04-22: qty 1

## 2017-04-22 SURGICAL SUPPLY — 76 items
APL SKNCLS STERI-STRIP NONHPOA (GAUZE/BANDAGES/DRESSINGS)
BANDAGE ACE 4X5 VEL STRL LF (GAUZE/BANDAGES/DRESSINGS) ×2 IMPLANT
BANDAGE ACE 6X5 VEL STRL LF (GAUZE/BANDAGES/DRESSINGS) ×2 IMPLANT
BANDAGE ESMARK 6X9 LF (GAUZE/BANDAGES/DRESSINGS) IMPLANT
BENZOIN TINCTURE PRP APPL 2/3 (GAUZE/BANDAGES/DRESSINGS) IMPLANT
BIT DRILL 2 CANN GRADUATED (BIT) ×1 IMPLANT
BIT DRILL 2.5 CANN STRL (BIT) ×1 IMPLANT
BIT DRILL 2.7 (BIT) ×2
BIT DRILL 2.7X2.7/3XSCR ANKL (BIT) IMPLANT
BIT DRL 2.7X2.7/3XSCR ANKL (BIT) ×1
BLADE SURG 10 STRL SS (BLADE) ×2 IMPLANT
BLADE SURG 15 STRL LF DISP TIS (BLADE) ×2 IMPLANT
BLADE SURG 15 STRL SS (BLADE) ×2
BNDG CMPR 9X6 STRL LF SNTH (GAUZE/BANDAGES/DRESSINGS)
BNDG COHESIVE 4X5 TAN STRL (GAUZE/BANDAGES/DRESSINGS) ×2 IMPLANT
BNDG ESMARK 6X9 LF (GAUZE/BANDAGES/DRESSINGS)
CHLORAPREP W/TINT 26ML (MISCELLANEOUS) ×2 IMPLANT
COVER BACK TABLE 60X90IN (DRAPES) ×2 IMPLANT
CUFF TOURNIQUET SINGLE 24IN (TOURNIQUET CUFF) IMPLANT
CUFF TOURNIQUET SINGLE 34IN LL (TOURNIQUET CUFF) ×1 IMPLANT
DECANTER SPIKE VIAL GLASS SM (MISCELLANEOUS) IMPLANT
DRAPE EXTREMITY T 121X128X90 (DRAPE) ×2 IMPLANT
DRAPE IMP U-DRAPE 54X76 (DRAPES) ×2 IMPLANT
DRAPE OEC MINIVIEW 54X84 (DRAPES) ×2 IMPLANT
DRAPE U-SHAPE 47X51 STRL (DRAPES) ×2 IMPLANT
DRSG PAD ABDOMINAL 8X10 ST (GAUZE/BANDAGES/DRESSINGS) IMPLANT
ELECT REM PT RETURN 9FT ADLT (ELECTROSURGICAL) ×2
ELECTRODE REM PT RTRN 9FT ADLT (ELECTROSURGICAL) ×1 IMPLANT
GAUZE SPONGE 4X4 12PLY STRL (GAUZE/BANDAGES/DRESSINGS) ×2 IMPLANT
GAUZE XEROFORM 1X8 LF (GAUZE/BANDAGES/DRESSINGS) ×2 IMPLANT
GLOVE BIOGEL PI IND STRL 7.0 (GLOVE) IMPLANT
GLOVE BIOGEL PI IND STRL 8 (GLOVE) ×1 IMPLANT
GLOVE BIOGEL PI INDICATOR 7.0 (GLOVE) ×2
GLOVE BIOGEL PI INDICATOR 8 (GLOVE)
GLOVE ECLIPSE 6.5 STRL STRAW (GLOVE) ×1 IMPLANT
GLOVE ECLIPSE 8.0 STRL XLNG CF (GLOVE) ×2 IMPLANT
GOWN STRL REUS W/ TWL LRG LVL3 (GOWN DISPOSABLE) ×1 IMPLANT
GOWN STRL REUS W/TWL LRG LVL3 (GOWN DISPOSABLE) ×2
GOWN STRL REUS W/TWL XL LVL3 (GOWN DISPOSABLE) ×2 IMPLANT
IMPL PLATE 6H (Plate) IMPLANT
IMPLANT PLATE 6H (Plate) ×2 IMPLANT
NEEDLE HYPO 22GX1.5 SAFETY (NEEDLE) IMPLANT
NS IRRIG 1000ML POUR BTL (IV SOLUTION) ×2 IMPLANT
PACK BASIN DAY SURGERY FS (CUSTOM PROCEDURE TRAY) ×2 IMPLANT
PAD CAST 4YDX4 CTTN HI CHSV (CAST SUPPLIES) ×1 IMPLANT
PADDING CAST ABS 4INX4YD NS (CAST SUPPLIES) ×2
PADDING CAST ABS COTTON 4X4 ST (CAST SUPPLIES) ×2 IMPLANT
PADDING CAST COTTON 4X4 STRL (CAST SUPPLIES) ×2
PADDING CAST COTTON 6X4 STRL (CAST SUPPLIES) ×2 IMPLANT
PENCIL BUTTON HOLSTER BLD 10FT (ELECTRODE) ×2 IMPLANT
SCREW CANC T15 FT 16X4XST (Screw) IMPLANT
SCREW CANCELLOUS 4.0X16MM (Screw) ×4 IMPLANT
SCREW CORTICAL 2.7X18 LO PRO (Screw) ×1 IMPLANT
SCREW LOW PROFILE 3.5X14 (Screw) ×3 IMPLANT
SLEEVE SCD COMPRESS KNEE MED (MISCELLANEOUS) ×1 IMPLANT
SPLINT FAST PLASTER 5X30 (CAST SUPPLIES) ×20
SPLINT PLASTER CAST FAST 5X30 (CAST SUPPLIES) ×20 IMPLANT
SPONGE LAP 18X18 RF (DISPOSABLE) ×2 IMPLANT
STRIP CLOSURE SKIN 1/2X4 (GAUZE/BANDAGES/DRESSINGS) IMPLANT
SUCTION FRAZIER HANDLE 10FR (MISCELLANEOUS) ×1
SUCTION TUBE FRAZIER 10FR DISP (MISCELLANEOUS) ×1 IMPLANT
SUT ETHILON 3 0 PS 1 (SUTURE) ×2 IMPLANT
SUT MNCRL AB 4-0 PS2 18 (SUTURE) IMPLANT
SUT MON AB 3-0 SH 27 (SUTURE)
SUT MON AB 3-0 SH27 (SUTURE) IMPLANT
SUT VIC AB 0 SH 27 (SUTURE) ×1 IMPLANT
SUT VIC AB 2-0 SH 27 (SUTURE)
SUT VIC AB 2-0 SH 27XBRD (SUTURE) ×1 IMPLANT
SUT VIC AB 3-0 SH 27 (SUTURE) ×2
SUT VIC AB 3-0 SH 27X BRD (SUTURE) IMPLANT
SYR BULB 3OZ (MISCELLANEOUS) ×2 IMPLANT
SYR CONTROL 10ML LL (SYRINGE) IMPLANT
TOWEL OR 17X24 6PK STRL BLUE (TOWEL DISPOSABLE) ×3 IMPLANT
TOWEL OR NON WOVEN STRL DISP B (DISPOSABLE) ×2 IMPLANT
TUBE CONNECTING 20X1/4 (TUBING) ×2 IMPLANT
UNDERPAD 30X30 (UNDERPADS AND DIAPERS) ×2 IMPLANT

## 2017-04-22 NOTE — Anesthesia Postprocedure Evaluation (Signed)
Anesthesia Post Note  Patient: Alicia Horne  Procedure(s) Performed: OPEN REDUCTION INTERNAL FIXATION (ORIF) LEFT FIBULA FRACTURE (Left Ankle)     Patient location during evaluation: PACU Anesthesia Type: General Level of consciousness: awake and alert and oriented Pain management: pain level controlled Vital Signs Assessment: post-procedure vital signs reviewed and stable Respiratory status: spontaneous breathing, nonlabored ventilation and respiratory function stable Cardiovascular status: blood pressure returned to baseline and stable Postop Assessment: no apparent nausea or vomiting Anesthetic complications: no    Last Vitals:  Vitals:   04/22/17 0900 04/22/17 0915  BP: (!) 130/52 137/69  Pulse: 77 71  Resp: 15 15  Temp:    SpO2: 100% 100%    Last Pain:  Vitals:   04/22/17 0915  TempSrc:   PainSc: 0-No pain                 [name]

## 2017-04-22 NOTE — Progress Notes (Signed)
Assisted Dr. Royce Macadamia with left, ultrasound guided, popliteal/saphenous, adductor canal block. Side rails up, monitors on throughout procedure. See vital signs in flow sheet. Tolerated Procedure well.

## 2017-04-22 NOTE — Transfer of Care (Signed)
Immediate Anesthesia Transfer of Care Note  Patient: Alicia Horne  Procedure(s) Performed: OPEN REDUCTION INTERNAL FIXATION (ORIF) LEFT FIBULA FRACTURE (Left Ankle)  Patient Location: PACU  Anesthesia Type:General  Level of Consciousness: awake, alert  and oriented  Airway & Oxygen Therapy: Patient Spontanous Breathing and Patient connected to face mask oxygen  Post-op Assessment: Report given to RN and Post -op Vital signs reviewed and stable  Post vital signs: Reviewed and stable  Last Vitals:  Vitals:   04/22/17 0900 04/22/17 0915  BP: (!) 130/52 137/69  Pulse: 77 71  Resp: 15 15  Temp:    SpO2: 100% 100%    Last Pain:  Vitals:   04/22/17 0915  TempSrc:   PainSc: 0-No pain      Patients Stated Pain Goal: 4 (58/48/35 0757)  Complications: No apparent anesthesia complications

## 2017-04-22 NOTE — Anesthesia Procedure Notes (Signed)
Anesthesia Regional Block: Popliteal block   Pre-Anesthetic Checklist: ,, timeout performed, Correct Patient, Correct Site, Correct Laterality, Correct Procedure, Correct Position, site marked, Risks and benefits discussed,  Surgical consent,  Pre-op evaluation,  At surgeon's request and post-op pain management  Laterality: Left  Prep: chloraprep       Needles:  Injection technique: Single-shot  Needle Type: Echogenic Stimulator Needle     Needle Length: 10cm  Needle Gauge: 21   Needle insertion depth: 6 cm   Additional Needles:   Procedures:,,,, ultrasound used (permanent image in chart),,,,  Narrative:  Start time: 04/22/2017 7:24 AM End time: 04/22/2017 7:29 AM Injection made incrementally with aspirations every 5 mL.  Performed by: Personally  Anesthesiologist: Josephine Igo, MD  Additional Notes: Timeout performed. Patient sedated. Relevant anatomy ID'd using [place]. Incremental 2-28ml injection of LA with frequent aspiration. Patient tolerated procedure well.        Left Popliteal Block

## 2017-04-22 NOTE — Discharge Instructions (Signed)
Regional Anesthesia Blocks ? ?1. Numbness or the inability to move the "blocked" extremity may last from 3-48 hours after placement. The length of time depends on the medication injected and your individual response to the medication. If the numbness is not going away after 48 hours, call your surgeon. ? ?2. The extremity that is blocked will need to be protected until the numbness is gone and the  Strength has returned. Because you cannot feel it, you will need to take extra care to avoid injury. Because it may be weak, you may have difficulty moving it or using it. You may not know what position it is in without looking at it while the block is in effect. ? ?3. For blocks in the legs and feet, returning to weight bearing and walking needs to be done carefully. You will need to wait until the numbness is entirely gone and the strength has returned. You should be able to move your leg and foot normally before you try and bear weight or walk. You will need someone to be with you when you first try to ensure you do not fall and possibly risk injury. ? ?4. Bruising and tenderness at the needle site are common side effects and will resolve in a few days. ? ?5. Persistent numbness or new problems with movement should be communicated to the surgeon or the [facility] ([phone])/ [facility] ([phone]).  ? ?Post Anesthesia Home Care Instructions ? ?Activity: ?Get plenty of rest for the remainder of the day. A responsible individual must stay with you for 24 hours following the procedure.  ?For the next 24 hours, DO NOT: ?-Drive a car ?-Operate machinery ?-Drink alcoholic beverages ?-Take any medication unless instructed by your physician ?-Make any legal decisions or sign important papers. ? ?Meals: ?Start with liquid foods such as gelatin or soup. Progress to regular foods as tolerated. Avoid greasy, spicy, heavy foods. If nausea and/or vomiting occur, drink only clear liquids until the  nausea and/or vomiting subsides. Call your physician if vomiting continues. ? ?Special Instructions/Symptoms: ?Your throat may feel dry or sore from the anesthesia or the breathing tube placed in your throat during surgery. If this causes discomfort, gargle with warm salt water. The discomfort should disappear within 24 hours. ? ?If you had a scopolamine patch placed behind your ear for the management of post- operative nausea and/or vomiting: ? ?1. The medication in the patch is effective for 72 hours, after which it should be removed.  Wrap patch in a tissue and discard in the trash. Wash hands thoroughly with soap and water. ?2. You may remove the patch earlier than 72 hours if you experience unpleasant side effects which may include dry mouth, dizziness or visual disturbances. ?3. Avoid touching the patch. Wash your hands with soap and water after contact with the patch. ?    ?

## 2017-04-22 NOTE — Op Note (Signed)
Orthopaedic Surgery Operative Note (CSN: [id number])  Alicia Horne  [birth year] Date of Surgery: 04/22/2017   Diagnoses:  LEFT FIBULA FRACTURE  Procedures:   * OPEN REDUCTION INTERNAL FIXATION (ORIF) LEFT FIBULA FRACTURE    Operative Finding Successful completion of planned procedure.  Medium bone quality, well controlled diabetic.  Lag screw w reasonable purchase.  Neutralization plate  Post-operative plan: The patient will be TDWB for 6 weeks, first 2 weeks in boot.  The patient will be dc home.  DVT prophylaxis ASA 81mg  qd.  Pain control with PRN pain medication preferring oral medicines.  Follow up plan will be scheduled in approximately 10-14 days for wound check and xr.  Nylon sutures in place  Post-Op Diagnosis: Same Surgeons:Primary: Hiram Gash, MD Assistants:non Location: [place] [facility] OR ROOM 6 Anesthesia: General Antibiotics: Ancef 2g preop, vancomycin local Tourniquet time:  Total Tourniquet Time Documented: Thigh (Left) - 35 minutes Total: Thigh (Left) - 35 minutes  Estimated Blood Loss: 10 Complications: None Specimens: None Implants: Implant Name Type Inv. Item Serial No. Manufacturer Lot No. LRB No. Used Action  plate 6 hole Plate   [employer]  Left 1 Implanted  SCREW LOW PROFILE 3.5X14 - [id number] Screw SCREW LOW PROFILE 3.5X14  [employer]  Left 3 Implanted  SCREW CORTICAL 2.7X18 LO PRO - [id number] Screw SCREW CORTICAL 2.7X18 LO PRO  [employer]  Left 1 Implanted  SCREW CANCELLOUS 4.0X16MM - [id number] Screw SCREW CANCELLOUS 4.0X16MM  [employer]  Left 2 Implanted    Indications for Surgery:   Alicia Horne is a 52 y.o. female with well controlled diabetes who is very active who has a displaced fibula fracture with shortening.  Benefits and risks of operative and nonoperative management were discussed prior to surgery with patient/guardian(s) and informed consent form was completed.  Specific risks including infection, need for additional surgery, hardware pain,  hardware failure, stiffness   Procedure:   The patient was identified in the preoperative holding area where the surgical site was marked. The patient was taken to the OR where a procedural timeout was called and the above noted anesthesia was induced.  The patient was positioned supine with bone foam.  Preoperative antibiotics were dosed.  The patient's left ankle was prepped and draped in the usual sterile fashion.  A second preoperative timeout was called.      A tourniquet was used for the above listed time.  We began with our ORIF of the fibula. A longitudinal approach was made along the lateral border of the fibula centered at the fracture site. We dissected down taking care to avoid the superficial peroneal nerve which crossed proximal to our incision. We encountered the fracture site and noted a oblique short fracture. The bone quality was medium. We are able to reduce it anatomically . We identified that the fracture was amenable to lag screw fixation which we performed by technique. We then filled 2 holes distal to the fracture site and 3 holes proximal achieving bicortical fixation with all screws.  We confirmed anatomic reduction of fluoroscopy and stress views were stable.  The incision was thoroughly irrigated and closed in a multilayer fashion.  A sterile dressing was placed.  Splint placed. The patient was awoken from general anesthesia and taken to the PACU in stable condition without complication.

## 2017-04-22 NOTE — Anesthesia Procedure Notes (Signed)
Anesthesia Regional Block: Adductor canal block   Pre-Anesthetic Checklist: ,, timeout performed, Correct Patient, Correct Site, Correct Laterality, Correct Procedure, Correct Position, site marked, Risks and benefits discussed,  Surgical consent,  Pre-op evaluation,  At surgeon's request and post-op pain management  Laterality: Left  Prep: chloraprep       Needles:  Injection technique: Single-shot  Needle Type: Echogenic Stimulator Needle     Needle Length: 10cm  Needle Gauge: 21   Needle insertion depth: 5 cm   Additional Needles:   Procedures:,,,, ultrasound used (permanent image in chart),,,,  Narrative:  Start time: 04/22/2017 7:30 AM End time: 04/22/2017 7:35 AM Injection made incrementally with aspirations every 5 mL.  Performed by: Personally  Anesthesiologist: Josephine Igo, MD  Additional Notes: Timeout performed. Patient sedated. Relevant anatomy ID'd using [place]. Incremental 2-66ml injection of LA with frequent aspiration. Patient tolerated procedure well.        Left Adductor Canal Block

## 2017-04-22 NOTE — Interval H&P Note (Signed)
Discussed case, risks and benefits with patient again.  All questions answered, no change to history.  Wess Baney MD  

## 2017-04-22 NOTE — Anesthesia Procedure Notes (Signed)
Procedure Name: LMA Insertion Performed by: Verita Lamb, CRNA Pre-anesthesia Checklist: Patient identified, Emergency Drugs available, Suction available, Patient being monitored and Timeout performed Patient Re-evaluated:Patient Re-evaluated prior to induction Oxygen Delivery Method: Circle system utilized Preoxygenation: Pre-oxygenation with 100% oxygen Induction Type: IV induction LMA: LMA inserted LMA Size: 3.0 Tube type: Oral Placement Confirmation: CO2 detector,  positive ETCO2 and breath sounds checked- equal and bilateral Tube secured with: Tape Dental Injury: Teeth and Oropharynx as per pre-operative assessment

## 2017-04-22 NOTE — Anesthesia Preprocedure Evaluation (Addendum)
Anesthesia Evaluation  Patient identified by MRN, date of birth, ID band Patient awake    Reviewed: Allergy & Precautions, H&P , NPO status , Patient's Chart, lab work & pertinent test results, reviewed documented beta blocker date and time   Airway Mallampati: II  TM Distance: >3 FB Neck ROM: Full    Dental  (+) Dental Advisory Given, Teeth Intact   Pulmonary neg pulmonary ROS,    breath sounds clear to auscultation       Cardiovascular hypertension,  Rhythm:Regular Rate:Normal  Denies cardiac sumptoms   Neuro/Psych negative neurological ROS  negative psych ROS   GI/Hepatic Neg liver ROS, gallstones Hx/o Gastric sleeve resection   Endo/Other  diabetes, Type 2, Oral Hypoglycemic AgentsHypothyroidism Obesity  Renal/GU Renal diseaseLf renal mass  negative genitourinary   Musculoskeletal negative musculoskeletal ROS (+)   Abdominal (+) + obese,   Peds  Hematology  (+) anemia ,   Anesthesia Other Findings   Reproductive/Obstetrics negative OB ROS                          Anesthesia Physical  Anesthesia Plan  ASA: II  Anesthesia Plan: General   Post-op Pain Management:  Regional for Post-op pain   Induction: Intravenous  PONV Risk Score and Plan:   Airway Management Planned: Oral ETT  Additional Equipment:   Intra-op Plan:   Post-operative Plan: Extubation in OR  Informed Consent: I have reviewed the patients History and Physical, chart, labs and discussed the procedure including the risks, benefits and alternatives for the proposed anesthesia with the patient or authorized representative who has indicated his/her understanding and acceptance.   Dental advisory given  Plan Discussed with: CRNA and Surgeon  Anesthesia Plan Comments:        Anesthesia Quick Evaluation

## 2017-04-23 ENCOUNTER — Encounter (HOSPITAL_BASED_OUTPATIENT_CLINIC_OR_DEPARTMENT_OTHER): Payer: Self-pay | Admitting: Orthopaedic Surgery

## 2017-04-23 NOTE — Addendum Note (Signed)
Addendum  created 04/23/17 8590 by Miah Boye, Ernesta Amble, CRNA   Charge Capture section accepted

## 2017-04-27 DIAGNOSIS — S8262XA Displaced fracture of lateral malleolus of left fibula, initial encounter for closed fracture: Secondary | ICD-10-CM | POA: Diagnosis not present

## 2017-05-06 DIAGNOSIS — S8262XD Displaced fracture of lateral malleolus of left fibula, subsequent encounter for closed fracture with routine healing: Secondary | ICD-10-CM | POA: Diagnosis not present

## 2017-06-01 ENCOUNTER — Ambulatory Visit (INDEPENDENT_AMBULATORY_CARE_PROVIDER_SITE_OTHER): Payer: BLUE CROSS/BLUE SHIELD | Admitting: Internal Medicine

## 2017-06-01 ENCOUNTER — Encounter: Payer: Self-pay | Admitting: Internal Medicine

## 2017-06-01 VITALS — BP 116/64 | HR 70 | Ht 63.0 in | Wt 170.0 lb

## 2017-06-01 DIAGNOSIS — E039 Hypothyroidism, unspecified: Secondary | ICD-10-CM

## 2017-06-01 DIAGNOSIS — E119 Type 2 diabetes mellitus without complications: Secondary | ICD-10-CM | POA: Diagnosis not present

## 2017-06-01 LAB — POCT GLYCOSYLATED HEMOGLOBIN (HGB A1C): HEMOGLOBIN A1C: 6.3

## 2017-06-01 MED ORDER — METFORMIN HCL 1000 MG PO TABS: ORAL_TABLET | ORAL | 3 refills | Status: DC

## 2017-06-01 MED ORDER — LEVOTHYROXINE SODIUM 100 MCG PO TABS: 100.0000 ug | ORAL_TABLET | Freq: Every day | ORAL | 3 refills | Status: DC

## 2017-06-01 NOTE — Progress Notes (Signed)
Patient ID: Alicia Horne, female   DOB: 12-02-1965, 52 y.o.   MRN: [id number]  HPI: Alicia Horne is a 52 y.o.-year-old female, returning for f/u for DM2, dx in 2001 (GDM 1998), insulin-dependent since 2015, uncontrolled, without complications. Last visit 8 months ago. New PCP: Silvio Pate, PA Insurance: [facility], Rx insurance: [employer].   She had L fibula fx 03/2017 on the skating rink >> ORIF  DM 2: She had Gastric sleeve in 09/24/2015.  She lost more than 130 pounds since then.  She feels great and has a lot of energy.  Last hemoglobin A1c was: 01/19/2017: 6.0% Lab Results  Component Value Date   HGBA1C 5.7 10/02/2016   HGBA1C 5.4 06/03/2016   HGBA1C 7.4 12/03/2015   Pt was on a regimen of: - Metformin 1000 mg 2x a day (better tolerated after cholecystectomy 2013) - Amaryl 4 mg in am and 2 mg before dinner - Toujeo 50 units daily - started 07/28/2014 - Invokana 100 mg >> Jardiance 25 mg in am before breakfast  We stopped NPH 70 units in am and 40 units at bedtime Previously on Lantus (insurance coverage) 60 units bid.  She tried Victoza >> nausea.  After the GBP: - Metformin 500 >> 1000 mg twice a day >> 1000 mg at lunchtime We stopped Amaryl 4 mg before B and D in 05/2016  Pt checks her sugars once a day: - am:  160s >> 130s >> 70-80 >> 110 >> 100 - 2h after b'fast:  <130 >> n/c >> 98-99 >> n/c - before lunch:  n/c >> 110 (130s) >> 98-100 >> 125-130 - 2h after lunch: <130 >> 110-141 >> n/c >> 117 >> n/c - before dinner: 110-115, 120 >> 90-120 >> n/c  - 2h after dinner:  <130 >> 128, 129 >> n/c - bedtime:145 >> 98-99 >> 90-130 - nighttime: n/c >> 131-182 >> see above Lowest sugar was 85 >> 140; she has hypoglycemia awareness in the 80s. Highest sugar was 130 >> 140.  She was walking 10,000 steps a day >> stopped after her fx.  Glucometer: ReliOn  Received labs drawn on 01/19/2017: -HbA1c 6.0% -Lipids: 191/130/56/109 -CMP: Glucose 96, BUN/creatinine  17/0.72, the rest normal -ACR: less than 4.6 -Vitamin B12 1939 -TSH 1.65, free T4 1.33 -Vitamin D 41.9  Received records from PCP Silvio Pate, [place]). Labs drawn 01/02/2016: - HbA1c 7.2%, increased from 7.0% - ACR 14.1 - CBC with differential normal - Glucose 144, BUN/creatinine 16/0.72 - Lipids: 211/236/38/126 - TSH 0.065. Her dose of Synthroid was decreased from 137 to 125 g  -No CKD, BUN/creatinine:  Lab Results  Component Value Date   BUN 27 (H) 04/21/2017   CREATININE 0.71 04/21/2017  She is off lisinopril. -+ HL; latest lipid panel improved: 01/19/2017: 191/130/56/109 Lab Results  Component Value Date   CHOL 191 12/30/2013   HDL 42 12/30/2013   LDLCALC 127 (H) 12/30/2013   TRIG 111 12/30/2013   CHOLHDL 4.5 12/30/2013  Off Pravastatin. - last eye exam was in 03/2017 >> No DR - she has numbness and tingling in her feet.  Hypothyroidism:  -  reviewed latest TSH: 01/19/2017: TSH 1.65, free T4 1.33 Lab Results  Component Value Date   TSH 0.08 (L) 10/09/2016   TSH 0.13 (L) 10/02/2016   TSH 0.03 (L) 06/02/2016   TSH 2.11 03/07/2015   TSH 2.19 08/30/2014   TSH 2.293 12/30/2013   TSH 8.346 (H) 03/28/2013  01/02/2016: TSH 0.065.   Pt is on  levothyroxine 100 mcg daily, taken: - at 6 am - fasting - at least 30 min from b'fast - + Ca, MVI 3-4h later - + Fe in the evening - + PPIs (as she started Mobic) at bedtime - not on Biotin  She had a partial nephrectomy for renal cancer. She also has HTN, HL, fatty liver, hypothyroidism, h/o anemia.  ROS: Constitutional: no weight gain/no weight loss, no fatigue, no subjective hyperthermia, no subjective hypothermia Eyes: no blurry vision, no xerophthalmia ENT: no sore throat, no nodules palpated in throat, no dysphagia, no odynophagia, no hoarseness Cardiovascular: no CP/no SOB/no palpitations/no leg swelling Respiratory: no cough/no SOB/no wheezing Gastrointestinal: no N/no V/no D/no C/no acid  reflux Musculoskeletal: no muscle aches/no joint aches Skin: no rashes, no hair loss Neurological: no tremors/no numbness/no tingling/no dizziness  I reviewed pt's medications, allergies, PMH, social hx, family hx, and changes were documented in the history of present illness. Otherwise, unchanged from my initial visit note.  Past Medical History:  Diagnosis Date  . Anemia   . Back pain, chronic   . Diabetes mellitus   . Fibula fracture    left  . Gallstones   . Hyperlipidemia   . Hypertension    no meds after weight loss  . Hypothyroidism   . Morbid obesity ([place])   . Renal cell cancer ([place])   . Renal mass, left    Past Surgical History:  Procedure Laterality Date  . [employer]  . CHOLECYSTECTOMY  07/03/2011   Procedure: LAPAROSCOPIC CHOLECYSTECTOMY WITH INTRAOPERATIVE CHOLANGIOGRAM;  Surgeon: Odis Hollingshead, MD;  Location: [facility];  Service: General;  Laterality: N/A;  . [place]  . LAPAROSCOPIC GASTRIC SLEEVE RESECTION  09/24/2015  . ORIF FIBULA FRACTURE Left 04/22/2017   Procedure: OPEN REDUCTION INTERNAL FIXATION (ORIF) LEFT FIBULA FRACTURE;  Surgeon: Hiram Gash, MD;  Location: [place];  Service: Orthopedics;  Laterality: Left;  . TUBAL LIGATION  1998   History   Social History  . Marital Status: Married    Spouse Name: N/A  . Number of Children: 2   Occupational History  . A/R clerk   Social History Main Topics  . Smoking status: Never Smoker   . Smokeless tobacco: Not on file  . Alcohol Use: No  . Drug Use: No   Current Outpatient Medications on File Prior to Visit  Medication Sig Dispense Refill  . aspirin 81 MG tablet Take 1 tablet (81 mg total) by mouth daily. 45 tablet 0  . Calcium Carbonate-Vitamin D (CALCIUM 600+D) 600-200 MG-UNIT TABS Take by mouth.    . levothyroxine (SYNTHROID, LEVOTHROID) 100 MCG tablet Take 1 tablet (100 mcg total) by mouth daily. 45 tablet 5  . meloxicam (MOBIC)  7.5 MG tablet Take 1 tablet (7.5 mg total) by mouth daily. 30 tablet 2  . metFORMIN (GLUCOPHAGE) 1000 MG tablet Take by mouth 1000 mg 2x a day with meals 180 tablet 3  . Multiple Vitamins-Minerals (MULTIVITAMIN WITH MINERALS) tablet Take 1 tablet by mouth daily.    . Omega-3 Fatty Acids (FISH OIL) 1000 MG CAPS Take by mouth.    Marland Kitchen omeprazole (PRILOSEC) 20 MG capsule Take 1 capsule (20 mg total) by mouth daily for 14 days. 14 capsule 0  . vitamin B-12 (CYANOCOBALAMIN) 100 MCG tablet Take 100 mcg by mouth every other day.     No current facility-administered medications on file prior to visit.    Allergies  Allergen Reactions  .  Victoza [Liraglutide]   . Other Other (See Comments)    Dermabond swelling/itching     . Tape Other (See Comments)    Makes her skin burn. Can tolerate paper tape   Family History  Problem Relation Age of Onset  . Hypertension Mother   . Hypertension Father   . Diabetes Father    PE: BP 116/64   Pulse 70   Ht 5\' 3"  (1.6 m) Comment: per patient-ortho boot  Wt 170 lb (77.1 kg) Comment: per patient-ortho boot  LMP 08/27/2011   SpO2 96%   BMI 30.11 kg/m  Body mass index is 30.11 kg/m. Wt Readings from Last 3 Encounters:  06/01/17 170 lb (77.1 kg)  04/22/17 174 lb (78.9 kg)  10/02/16 171 lb 9.6 oz (77.8 kg)   Constitutional: overweight, in NAD Eyes: PERRLA, EOMI, no exophthalmos ENT: moist mucous membranes, no thyromegaly, no cervical lymphadenopathy Cardiovascular: RRR, No MRG Respiratory: CTA B Gastrointestinal: abdomen soft, NT, ND, BS+ Musculoskeletal: no deformities, strength intact in all 4, + L foot in boot Skin: moist, warm, no rashes Neurological: no tremor with outstretched hands, DTR normal in all 4  ASSESSMENT: 1. DM2, insulin-independent, uncontrolled, without complications Now status post gastric sleeve surgery - ? PN  2. Hypothyroidism  PLAN:  1. Patient with long-standing, previously uncontrolled diabetes, then with dramatic  improvement in her diabetes control after her gastric sleeve surgery in 09/2015, after which she lost more than 130 pounds.  She was able to come off more than 100 units of insulin and most of her oral medicines.  At last visit, she was on 1000 mg of metformin twice a day and we did discuss about reducing the dose to 500 mg twice a day, but decided to stay on the same dose for then.  No low blood sugars and she tolerates metformin well. - at this visit, sugars are still at goal, but she is inactive 2/2 her fracture and she also takes 1/2 of the original Metformin dose: 1000 mg daily >> HbA1c is 6.3% (slightly higher) - her boot will come off soon >> she will start walking - will not change her regimen now - I suggested to:  Patient Instructions  Please continue: - Metformin 1000 mg daily at lunchtime  Please continue: - Levothyroxine 100 mcg daily  Take the thyroid hormone every day, with water, at least 30 minutes before breakfast, separated by at least 4 hours from: - acid reflux medications - calcium - iron - multivitamins  Please return in 6 months.   - continue checking sugars at different times of the day - check 1x a day, rotating checks - advised for yearly eye exams >> she is UTD - Return to clinic in 6 mo with sugar log    2. Hypothyroidism - latest thyroid labs reviewed with pt >> normal in 12/2016 after we decreasedher levothyroxine dose at last visit - she continues on LT4 100 mcg daily - pt feels good on this dose. - we discussed about taking the thyroid hormone every day, with water, >30 minutes before breakfast, separated by >4 hours from acid reflux medications, calcium, iron, multivitamins. Pt. is taking it correctly.  Philemon Kingdom, MD PhD [facility] Endocrinology

## 2017-06-01 NOTE — Patient Instructions (Addendum)
Please continue: - Metformin 1000 mg daily at lunchtime  Please continue: - Levothyroxine 100 mcg daily  Take the thyroid hormone every day, with water, at least 30 minutes before breakfast, separated by at least 4 hours from: - acid reflux medications - calcium - iron - multivitamins  Please return in 6 months.

## 2017-06-03 DIAGNOSIS — S8262XD Displaced fracture of lateral malleolus of left fibula, subsequent encounter for closed fracture with routine healing: Secondary | ICD-10-CM | POA: Diagnosis not present

## 2017-07-15 DIAGNOSIS — S8262XD Displaced fracture of lateral malleolus of left fibula, subsequent encounter for closed fracture with routine healing: Secondary | ICD-10-CM | POA: Diagnosis not present

## 2017-07-21 DIAGNOSIS — J0140 Acute pansinusitis, unspecified: Secondary | ICD-10-CM | POA: Diagnosis not present

## 2017-08-10 DIAGNOSIS — J029 Acute pharyngitis, unspecified: Secondary | ICD-10-CM | POA: Diagnosis not present

## 2017-09-06 ENCOUNTER — Encounter: Payer: Self-pay | Admitting: Internal Medicine

## 2017-09-06 ENCOUNTER — Other Ambulatory Visit: Payer: Self-pay | Admitting: Internal Medicine

## 2017-09-06 MED ORDER — FLUCONAZOLE 150 MG PO TABS: 150.0000 mg | ORAL_TABLET | Freq: Once | ORAL | 1 refills | Status: AC

## 2017-12-02 ENCOUNTER — Encounter: Payer: Self-pay | Admitting: Internal Medicine

## 2017-12-02 ENCOUNTER — Ambulatory Visit (INDEPENDENT_AMBULATORY_CARE_PROVIDER_SITE_OTHER): Payer: BLUE CROSS/BLUE SHIELD | Admitting: Internal Medicine

## 2017-12-02 VITALS — BP 120/70 | HR 65 | Ht 63.0 in | Wt 185.0 lb

## 2017-12-02 DIAGNOSIS — Z23 Encounter for immunization: Secondary | ICD-10-CM

## 2017-12-02 DIAGNOSIS — E119 Type 2 diabetes mellitus without complications: Secondary | ICD-10-CM | POA: Diagnosis not present

## 2017-12-02 DIAGNOSIS — E1165 Type 2 diabetes mellitus with hyperglycemia: Secondary | ICD-10-CM | POA: Diagnosis not present

## 2017-12-02 DIAGNOSIS — E039 Hypothyroidism, unspecified: Secondary | ICD-10-CM

## 2017-12-02 DIAGNOSIS — Z794 Long term (current) use of insulin: Secondary | ICD-10-CM | POA: Diagnosis not present

## 2017-12-02 LAB — POCT GLYCOSYLATED HEMOGLOBIN (HGB A1C): HEMOGLOBIN A1C: 6.7 % — AB (ref 4.0–5.6)

## 2017-12-02 LAB — TSH: TSH: 2.02 u[IU]/mL (ref 0.35–4.50)

## 2017-12-02 LAB — T4, FREE: FREE T4: 0.93 ng/dL (ref 0.60–1.60)

## 2017-12-02 NOTE — Patient Instructions (Addendum)
Please continue: - Metformin 1000 mg daily at lunchtime  Also continue: - Levothyroxine 100 mcg daily  Take the thyroid hormone every day, with water, at least 30 minutes before breakfast, separated by at least 4 hours from: - acid reflux medications - calcium - iron - multivitamins  Please stop at the lab.  Please return in 6 months.

## 2017-12-02 NOTE — Progress Notes (Signed)
Patient ID: Alicia Horne, female   DOB: [birth year], 52 y.o.   MRN: [id number]  HPI: Alicia Horne is a 52 y.o.-year-old female, returning for f/u for DM2, dx in 2001 (GDM 1998), insulin-dependent since 2015, uncontrolled, without complications. Last visit 6 months ago. New PCP: Silvio Pate, PA Insurance: [facility], Rx insurance: [employer].   She restarted to walk [phone] steps a day since last visit, but aiming to go back to 10,000 steps.  DM 2: She had gastric sleeve in 09/24/2015.  She lost more than 130 pounds since then.  Unfortunately, she gained 15 pounds since last visit.  Last hemoglobin A1c was: Lab Results  Component Value Date   HGBA1C 6.3 06/01/2017   HGBA1C 5.7 10/02/2016   HGBA1C 5.4 06/03/2016  01/19/2017: 6.0%  Pt was on a regimen of: - Metformin 1000 mg 2x a day (better tolerated after cholecystectomy 2013) - Amaryl 4 mg in am and 2 mg before dinner - Toujeo 50 units daily - started 07/28/2014 - Invokana 100 mg >> Jardiance 25 mg in am before breakfast  We stopped NPH 70 units in am and 40 units at bedtime Previously on Lantus (insurance coverage) 60 units bid.  She tried Victoza >> nausea.  After the GBP: - Metformin 500 >> 1000 mg twice a day >> 1000 mg at lunchtime We stopped Amaryl 4 mg before B and D in 05/2016  Pt checks her sugars once a day: - am:  70-80 >> 110 >> 100 >> 117-120 - 2h after b'fast:   98-99 >> n/c - before lunch:  98-100 >> 125-130 >> 125-130 - 2h after lunch:n/c >> 117 >> n/c - before dinner: 90-120 >> n/c  - 2h after dinner:128, 129 >> n/c - bedtime 98-99 >> 90-130 >> 140-150 - nighttime: n/c >> 131-182 >> see above Lowest sugar was 85 >> 140 >> 100; she has hypoglycemia awareness in the 80s Highest sugar was 130 >> 140 >> 120.  She had L fibula fx 03/2017 on the skating rink >> ORIF.  At last visit, she stopped walking 10,000 steps a day as she was doing before, after her fracture.  Since then, she restarted.  Glucometer:  ReliOn  Received labs drawn on 01/19/2017: -HbA1c 6.0% -Lipids: 191/130/56/109 -CMP: Glucose 96, BUN/creatinine 17/0.72, the rest normal -ACR: less than 4.6 -Vitamin B12 1939 -TSH 1.65, free T4 1.33 -Vitamin D 41.9  Received records from PCP Silvio Pate, [place]). Labs drawn 01/02/2016: - HbA1c 7.2%, increased from 7.0% - ACR 14.1 - CBC with differential normal - Glucose 144, BUN/creatinine 16/0.72 - Lipids: 211/236/38/126 - TSH 0.065. Her dose of Synthroid was decreased from 137 to 125 g  -No CKD, BUN/creatinine:  Lab Results  Component Value Date   BUN 27 (H) 04/21/2017   CREATININE 0.71 04/21/2017  She is off lisinopril. -+ HL; lipid panel: 01/19/2017: 191/130/56/109 Lab Results  Component Value Date   CHOL 191 12/30/2013   HDL 42 12/30/2013   LDLCALC 127 (H) 12/30/2013   TRIG 111 12/30/2013   CHOLHDL 4.5 12/30/2013  Of pravastatin. - last eye exam was in 03/2017: No DR -+ Numbness and tingling in her feet.  Hypothyroidism:   Reviewed latest TSH: 01/19/2017: TSH 1.65, free T4 1.33 Lab Results  Component Value Date   TSH 0.08 (L) 10/09/2016   TSH 0.13 (L) 10/02/2016   TSH 0.03 (L) 06/02/2016   TSH 2.11 03/07/2015   TSH 2.19 08/30/2014   TSH 2.293 12/30/2013   TSH 8.346 (H) 03/28/2013  01/02/2016: TSH 0.065.   Pt is on levothyroxine 100 Mcg daily, taken: -At 6 AM - fasting - at least 30 min from b'fast -+ Calcium and multivitamins at lunch - no iron - no PPIs anymore - not on Biotin  She had a partial nephrectomy for renal cancer. She also has HTN, HL, fatty liver, hypothyroidism, h/o anemia.  ROS: Constitutional: + weight gain/no weight loss, no fatigue, no subjective hyperthermia, no subjective hypothermia Eyes: no blurry vision, no xerophthalmia ENT: no sore throat, no nodules palpated in throat, no dysphagia, no odynophagia, no hoarseness Cardiovascular: no CP/no SOB/no palpitations/no leg swelling Respiratory: no cough/no SOB/no  wheezing Gastrointestinal: no N/no V/no D/no C/no acid reflux Musculoskeletal: no muscle aches/+ joint aches Skin: no rashes, no hair loss Neurological: no tremors/no numbness/no tingling/no dizziness  I reviewed pt's medications, allergies, PMH, social hx, family hx, and changes were documented in the history of present illness. Otherwise, unchanged from my initial visit note.  Past Medical History:  Diagnosis Date  . Anemia   . Back pain, chronic   . Diabetes mellitus   . Fibula fracture    left  . Gallstones   . Hyperlipidemia   . Hypertension    no meds after weight loss  . Hypothyroidism   . Morbid obesity ([place])   . Renal cell cancer ([place])   . Renal mass, left    Past Surgical History:  Procedure Laterality Date  . [place]  . CHOLECYSTECTOMY  07/03/2011   Procedure: LAPAROSCOPIC CHOLECYSTECTOMY WITH INTRAOPERATIVE CHOLANGIOGRAM;  Surgeon: Odis Hollingshead, MD;  Location: [facility];  Service: General;  Laterality: N/A;  . [place]  . LAPAROSCOPIC GASTRIC SLEEVE RESECTION  09/24/2015  . ORIF FIBULA FRACTURE Left 04/22/2017   Procedure: OPEN REDUCTION INTERNAL FIXATION (ORIF) LEFT FIBULA FRACTURE;  Surgeon: Hiram Gash, MD;  Location: [place];  Service: Orthopedics;  Laterality: Left;  . TUBAL LIGATION  1998   History   Social History  . Marital Status: Married    Spouse Name: N/A  . Number of Children: 2   Occupational History  . A/R clerk   Social History Main Topics  . Smoking status: Never Smoker   . Smokeless tobacco: Not on file  . Alcohol Use: No  . Drug Use: No   Current Outpatient Medications on File Prior to Visit  Medication Sig Dispense Refill  . Calcium Carbonate-Vitamin D (CALCIUM 600+D) 600-200 MG-UNIT TABS Take by mouth.    . levothyroxine (SYNTHROID, LEVOTHROID) 100 MCG tablet Take 1 tablet (100 mcg total) by mouth daily. 90 tablet 3  . meloxicam (MOBIC) 7.5 MG tablet Take 1 tablet  (7.5 mg total) by mouth daily. 30 tablet 2  . metFORMIN (GLUCOPHAGE) 1000 MG tablet Take by mouth 1000 mg 1x a day with a meal 90 tablet 3  . Multiple Vitamins-Minerals (MULTIVITAMIN WITH MINERALS) tablet Take 1 tablet by mouth daily.    . Omega-3 Fatty Acids (FISH OIL) 1000 MG CAPS Take by mouth.    Marland Kitchen omeprazole (PRILOSEC) 20 MG capsule Take 1 capsule (20 mg total) by mouth daily for 14 days. 14 capsule 0  . vitamin B-12 (CYANOCOBALAMIN) 100 MCG tablet Take 100 mcg by mouth every other day.     No current facility-administered medications on file prior to visit.    Allergies  Allergen Reactions  . Other Other (See Comments) and Rash    Dermabond swelling/itching    Makes her  skin burn  . Victoza [Liraglutide]   . Tape Other (See Comments)    Makes her skin burn. Can tolerate paper tape   Family History  Problem Relation Age of Onset  . Hypertension Mother   . Hypertension Father   . Diabetes Father    PE: BP 120/70   Pulse 65   Ht 5\' 3"  (1.6 m)   Wt 185 lb (83.9 kg)   LMP 08/27/2011   SpO2 98%   BMI 32.77 kg/m  Body mass index is 32.77 kg/m. Wt Readings from Last 3 Encounters:  12/02/17 185 lb (83.9 kg)  06/01/17 170 lb (77.1 kg)  04/22/17 174 lb (78.9 kg)   Constitutional: overweight, in NAD Eyes: PERRLA, EOMI, no exophthalmos ENT: moist mucous membranes, no thyromegaly, no cervical lymphadenopathy Cardiovascular: RRR, No MRG Respiratory: CTA B Gastrointestinal: abdomen soft, NT, ND, BS+ Musculoskeletal: no deformities, strength intact in all 4, left ankle screw visible under skin Skin: moist, warm, no rashes Neurological: no tremor with outstretched hands, DTR normal in all 4  ASSESSMENT: 1. DM2, insulin-independent, uncontrolled, without complications Now status post gastric sleeve surgery - ? PN  2. Hypothyroidism  3.  Weight gain  PLAN:  1. Patient with long-standing, previously uncontrolled diabetes, then with dramatic improvement of her diabetes  control after her gastric sleeve surgery 09/2015, after which she lost more than 130 pounds.  She was able to come off more than 100 units of insulin and most of her p.o. medicines.  She continues only on metformin 1000 mg 2x a day.  At last check, HbA1c was slightly higher, at 6.3%, after a period of inactivity due to her fracture.  Unfortunately, she gained 15 pounds. -At this visit, sugars are slightly higher, and mostly after dinner.  We discussed about limiting snacks and staying only with the main meals.  She plans to do so.  She also plans to start going to [employer] near her house. -For now, we can continue with her metformin once a day, but we may need to increase it to twice a day and even add other medicines if her sugars continue to increase. - I suggested to:  Patient Instructions  Please continue: - Metformin 1000 mg daily at lunchtime  Also continue: - Levothyroxine 100 mcg daily  Take the thyroid hormone every day, with water, at least 30 minutes before breakfast, separated by at least 4 hours from: - acid reflux medications - calcium - iron - multivitamins  Please return in 6 months.  - today, HbA1c is 6.7% (higher) - continue checking sugars at different times of the day - check 1x a day, rotating checks - advised for yearly eye exams >> she is UTD - + flu shot today - Return to clinic in 6 mo with sugar log     2. Hypothyroidism - latest thyroid labs reviewed with pt >> normal in 12/2016 - she continues on LT4 100 mcg daily - pt feels good on this dose. - we discussed about taking the thyroid hormone every day, with water, >30 minutes before breakfast, separated by >4 hours from acid reflux medications, calcium, iron, multivitamins. Pt. is taking it correctly. - will check thyroid tests today: TSH and fT4 - If labs are abnormal, she will need to return for repeat TFTs in 1.5 months  3.  Weight gain -We discussed about ways to improve this by improving meals  and increasing exercise.  Needs refills Metformin and LT4.  Component  Latest Ref Rng & Units 12/02/2017  TSH     0.35 - 4.50 uIU/mL 2.02  T4,Free(Direct)     0.60 - 1.60 ng/dL 0.93  TFTs normal.  We will refill her medications.  Philemon Kingdom, MD PhD [facility] Endocrinology

## 2017-12-03 MED ORDER — METFORMIN HCL 1000 MG PO TABS: ORAL_TABLET | ORAL | 3 refills | Status: DC

## 2017-12-03 MED ORDER — LEVOTHYROXINE SODIUM 100 MCG PO TABS: 100.0000 ug | ORAL_TABLET | Freq: Every day | ORAL | 3 refills | Status: DC

## 2018-03-05 DIAGNOSIS — K5900 Constipation, unspecified: Secondary | ICD-10-CM | POA: Diagnosis not present

## 2018-03-05 DIAGNOSIS — K5901 Slow transit constipation: Secondary | ICD-10-CM | POA: Diagnosis not present

## 2018-05-15 IMAGING — MG 2D DIGITAL SCREENING BILATERAL MAMMOGRAM WITH CAD AND ADJUNCT TO
8 of 13 series · 8 of 29 positions shown · non-contrast
Comparison: Previous exam(s).

CLINICAL DATA: Screening.

EXAM:
2D DIGITAL SCREENING BILATERAL MAMMOGRAM WITH CAD AND ADJUNCT TOMO

[R CV]
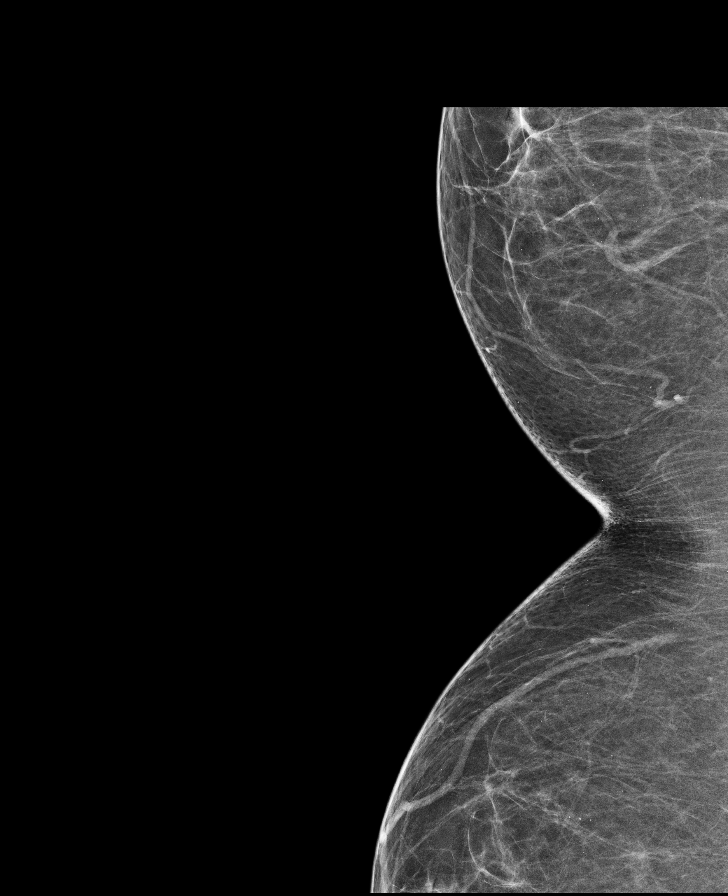

[R CC]
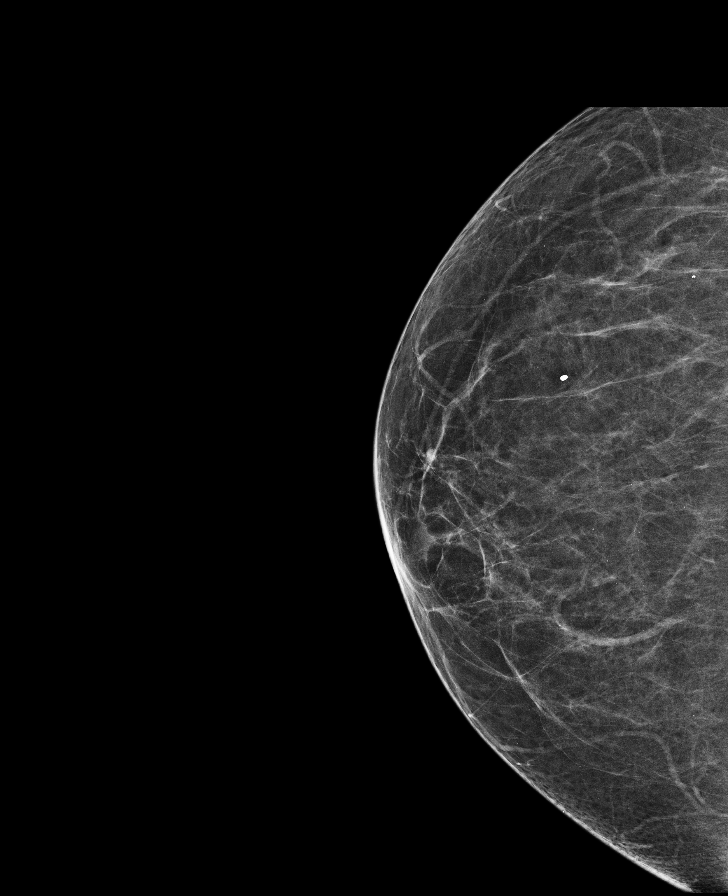

[L CC synth-2D]
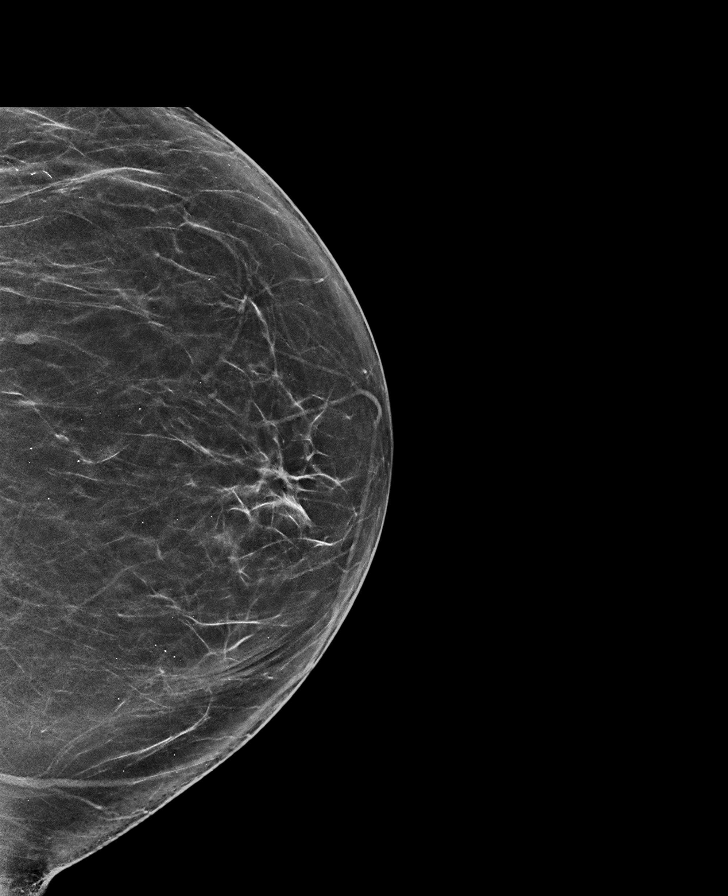

[R CC synth-2D]
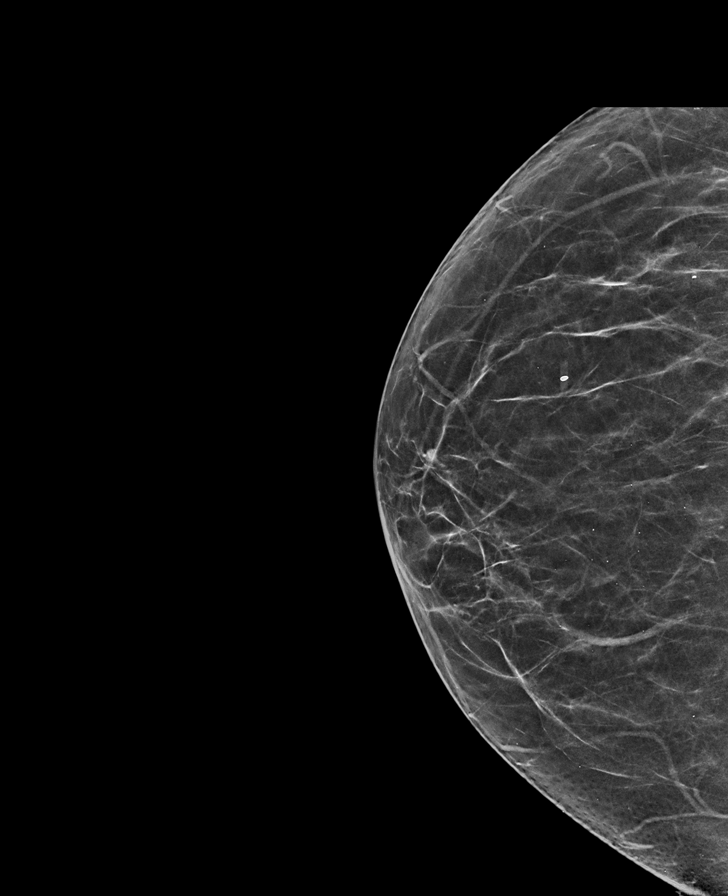

[R MLO synth-2D]
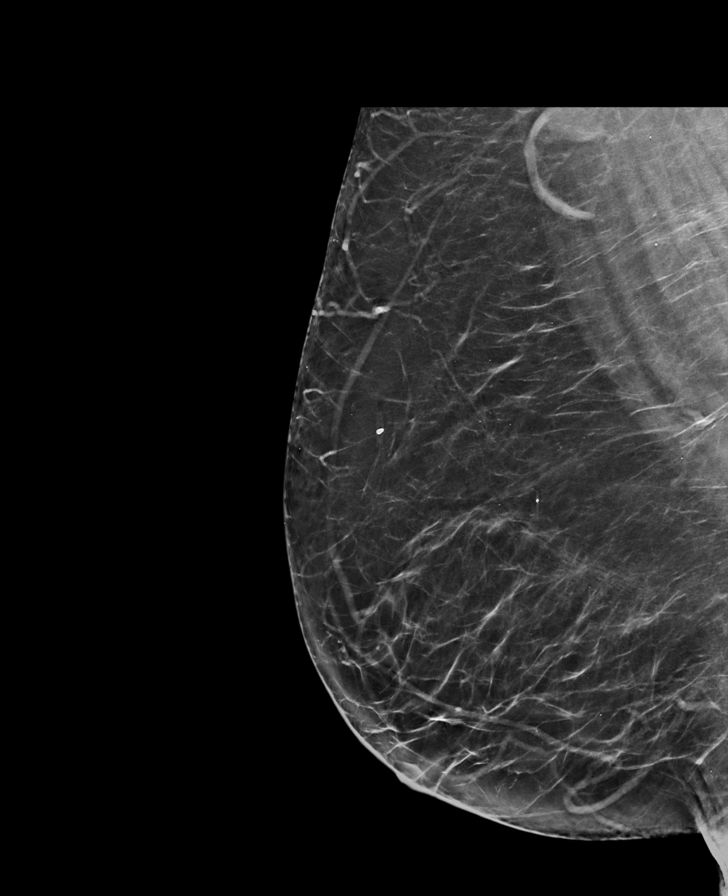

[L MLO synth-2D]
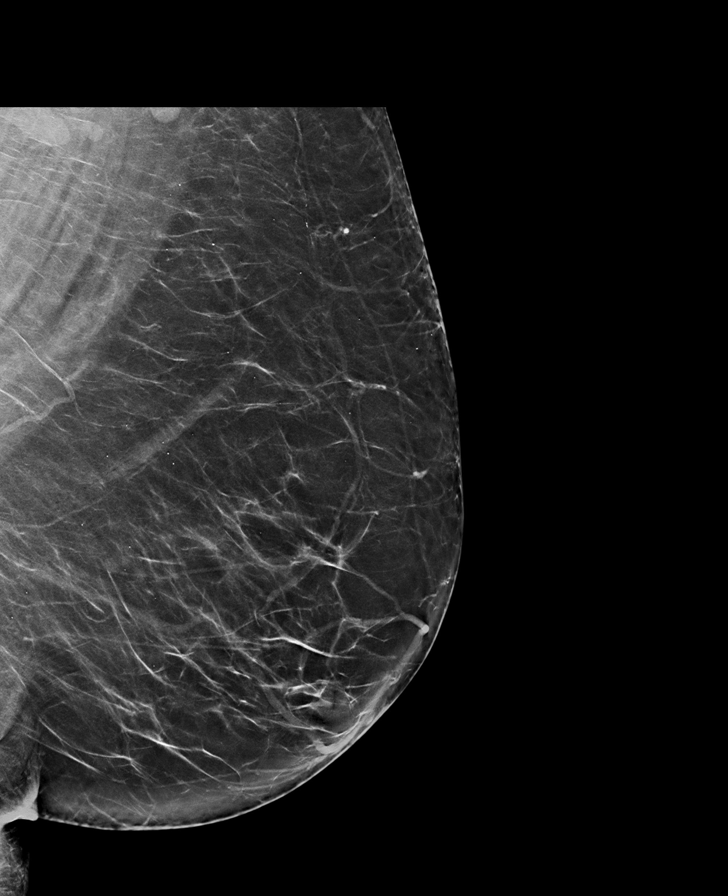

[L CC]
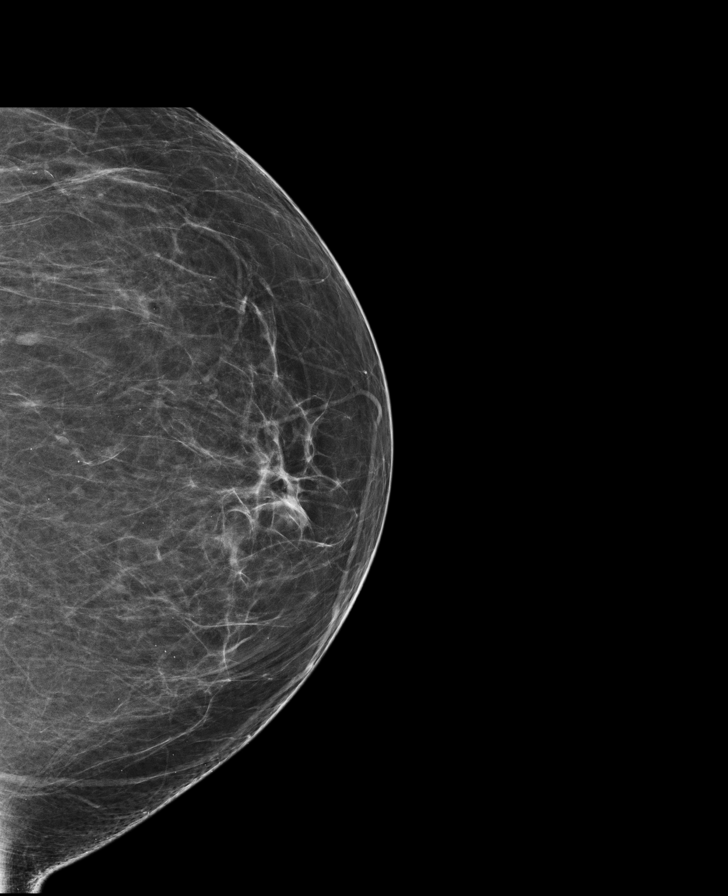

[L MLO]
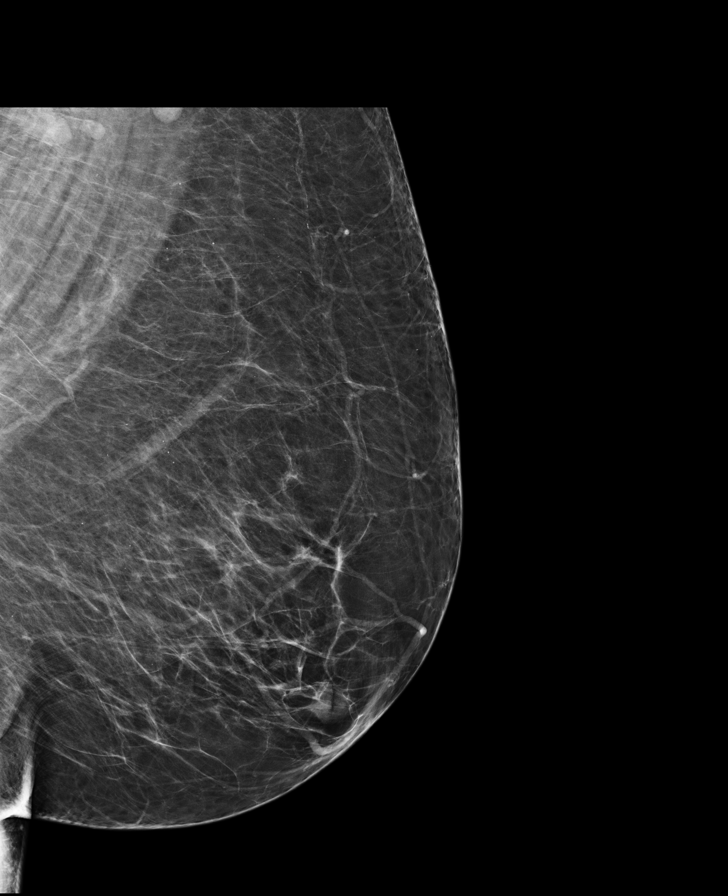

[8 of 29 positions shown; findings below may reference images not displayed]

ACR Breast Density Category b: There are scattered areas of
fibroglandular density.
FINDINGS: There are no findings suspicious for malignancy. Images were
processed with CAD.
IMPRESSION: No mammographic evidence of malignancy. A result letter of this
screening mammogram will be mailed directly to the patient.

RECOMMENDATION:
Screening mammogram in one year. (Code:97-6-RS4)

BI-RADS CATEGORY  1: Negative.

## 2018-06-02 ENCOUNTER — Ambulatory Visit (INDEPENDENT_AMBULATORY_CARE_PROVIDER_SITE_OTHER): Payer: BLUE CROSS/BLUE SHIELD | Admitting: Internal Medicine

## 2018-06-02 ENCOUNTER — Encounter: Payer: Self-pay | Admitting: Internal Medicine

## 2018-06-02 ENCOUNTER — Other Ambulatory Visit: Payer: Self-pay

## 2018-06-02 DIAGNOSIS — R635 Abnormal weight gain: Secondary | ICD-10-CM | POA: Diagnosis not present

## 2018-06-02 DIAGNOSIS — E039 Hypothyroidism, unspecified: Secondary | ICD-10-CM

## 2018-06-02 DIAGNOSIS — E1165 Type 2 diabetes mellitus with hyperglycemia: Secondary | ICD-10-CM | POA: Diagnosis not present

## 2018-06-02 DIAGNOSIS — Z794 Long term (current) use of insulin: Secondary | ICD-10-CM | POA: Diagnosis not present

## 2018-06-02 MED ORDER — METFORMIN HCL 1000 MG PO TABS: ORAL_TABLET | ORAL | 3 refills | Status: AC

## 2018-06-02 MED ORDER — OMEPRAZOLE 20 MG PO CPDR: 20.0000 mg | DELAYED_RELEASE_CAPSULE | Freq: Every day | ORAL | 1 refills | Status: DC

## 2018-06-02 NOTE — Progress Notes (Signed)
Patient ID: Alicia Horne, female   DOB: [birth year], 53 y.o.   MRN: [id number]  Patient location: Work My location: Office  Referring Provider: Silvio Pate, PA-C  I connected with the patient on 06/02/18 at  9:23 AM EDT by a video enabled telemedicine application and verified that I am speaking with the correct person.   I discussed the limitations of evaluation and management by telemedicine and the availability of in person appointments. The patient expressed understanding and agreed to proceed.   Details of the encounter are shown below.  HPI: Alicia Horne is a 53 y.o.-year-old female, presenting for f/u for DM2, dx in 2001 (GDM 1998), insulin-dependent since 2015, uncontrolled, without complications. Last visit 6 months ago. New PCP: Silvio Pate, PA Insurance: [facility], Rx insurance: [employer].   Since last visit, her mother passed away. She changed jobs in 03/2018. She was not exercising >> restarted walking 2 weeks ago. Gained 25 lbs since last OV.  DM 2: She had gastric sleeve in 09/24/2015.  She lost more than 100 pounds since then.  Before last visit, she gained 15 pounds back.  Last hemoglobin A1c was: Lab Results  Component Value Date   HGBA1C 6.7 (A) 12/02/2017   HGBA1C 6.3 06/01/2017   HGBA1C 5.7 10/02/2016  01/19/2017: 6.0%  She was on a regimen of: - Metformin 1000 mg 2x a day (better tolerated after cholecystectomy 2013) - Amaryl 4 mg in am and 2 mg before dinner - Toujeo 50 units daily - started 07/28/2014 - Invokana 100 mg >> Jardiance 25 mg in am before breakfast  We stopped NPH 70 units in am and 40 units at bedtime Previously on Lantus (insurance coverage) 60 units bid.  She tried Victoza >> nausea.  After the GBP: - Metformin 500 >> 1000 mg twice a day >> 1000 mg at lunchtime We stopped Amaryl 4 mg before B and D in 05/2016  Pt checks her sugars once a day: - am:  70-80 >> 110 >> 100 >> 117-120 >> 120-125 - 2h after b'fast:   98-99 >>  n/c - before lunch:  98-100 >> 125-130 >> 125-130 >> n/c - 2h after lunch:n/c >> 117 >> n/c - before dinner: 90-120 >> n/c  - 2h after dinner:128, 129 >> n/c - bedtime 98-99 >> 90-130 >> 140-150 >> 120-145 - nighttime: n/c >> 131-182 >> see above Lowest sugar was 85 >> 140 >> 100 >> 100; she has hypoglycemia awareness in the 80s. Highest sugar was 120 >> 145.  She had L fibula fx 03/2017 on the skating rink >> ORIF.  At last visit, she stopped walking 10,000 steps a day as she was doing before, after her fracture.  Since then, she restarted.  Glucometer: ReliOn  Reviewed labs drawn on 01/19/2017: -HbA1c 6.0% -Lipids: 191/130/56/109 -CMP: Glucose 96, BUN/creatinine 17/0.72, the rest normal -ACR: less than 4.6 -Vitamin B12 1939 -TSH 1.65, free T4 1.33 -Vitamin D 41.9  Reviewed records from PCP Silvio Pate, [place]). Labs drawn 01/02/2016: - HbA1c 7.2%, increased from 7.0% - ACR 14.1 - CBC with differential normal - Glucose 144, BUN/creatinine 16/0.72 - Lipids: 211/236/38/126 - TSH 0.065. Her dose of Synthroid was decreased from 137 to 125 g  -No CKD, BUN/creatinine:  Lab Results  Component Value Date   BUN 27 (H) 04/21/2017   CREATININE 0.71 04/21/2017  Off lisinopril. -+ HL lipid panel: 01/19/2017: 191/130/56/109 Lab Results  Component Value Date   CHOL 191 12/30/2013   HDL 42 12/30/2013  LDLCALC 127 (H) 12/30/2013   TRIG 111 12/30/2013   CHOLHDL 4.5 12/30/2013  Off pravastatin. - last eye exam was in 03/2017: No DR - + Numbness and tingling in her feet.  Hypothyroidism:   Reviewed her latest thyroid tests: Lab Results  Component Value Date   TSH 2.02 12/02/2017   TSH 0.08 (L) 10/09/2016   TSH 0.13 (L) 10/02/2016   TSH 0.03 (L) 06/02/2016   TSH 2.11 03/07/2015   TSH 2.19 08/30/2014   TSH 2.293 12/30/2013   TSH 8.346 (H) 03/28/2013  01/19/2017: TSH 1.65, free T4 1.33 01/02/2016: TSH 0.065.   Pt is on levothyroxine 100 Mcg daily, taken: - in am (around  6 AM) - fasting - at least 30 min from b'fast - no Fe - + PPIs - as needed, at night - + Calcium and multivitamins at lunchtime - not on Biotin  She had a partial nephrectomy for renal cancer. She also has HTN, HL, fatty liver, hypothyroidism, h/o anemia.  ROS: Constitutional: + weight gain/no weight loss, no fatigue, no subjective hyperthermia, no subjective hypothermia Eyes: no blurry vision, no xerophthalmia ENT: no sore throat, no nodules palpated in neck, no dysphagia, no odynophagia, no hoarseness Cardiovascular: no CP/no SOB/no palpitations/no leg swelling Respiratory: no cough/no SOB/no wheezing Gastrointestinal: no N/no V/no D/no C/no acid reflux Musculoskeletal: no muscle aches/no joint aches Skin: no rashes, no hair loss Neurological: no tremors/+ numbness/+ tingling/no dizziness  I reviewed pt's medications, allergies, PMH, social hx, family hx, and changes were documented in the history of present illness. Otherwise, unchanged from my initial visit note.  Past Medical History:  Diagnosis Date  . Anemia   . Back pain, chronic   . Diabetes mellitus   . Fibula fracture    left  . Gallstones   . Hyperlipidemia   . Hypertension    no meds after weight loss  . Hypothyroidism   . Morbid obesity ([place])   . Renal cell cancer ([place])   . Renal mass, left    Past Surgical History:  Procedure Laterality Date  . [place]  . CHOLECYSTECTOMY  07/03/2011   Procedure: LAPAROSCOPIC CHOLECYSTECTOMY WITH INTRAOPERATIVE CHOLANGIOGRAM;  Surgeon: Odis Hollingshead, MD;  Location: [facility];  Service: General;  Laterality: N/A;  . [place]  . LAPAROSCOPIC GASTRIC SLEEVE RESECTION  09/24/2015  . ORIF FIBULA FRACTURE Left 04/22/2017   Procedure: OPEN REDUCTION INTERNAL FIXATION (ORIF) LEFT FIBULA FRACTURE;  Surgeon: Hiram Gash, MD;  Location: [facility];  Service: Orthopedics;  Laterality: Left;  . TUBAL LIGATION  1998    History   Social History  . Marital Status: Married    Spouse Name: N/A  . Number of Children: 2   Occupational History  . A/R clerk   Social History Main Topics  . Smoking status: Never Smoker   . Smokeless tobacco: Not on file  . Alcohol Use: No  . Drug Use: No   Current Outpatient Medications on File Prior to Visit  Medication Sig Dispense Refill  . Calcium Carbonate-Vitamin D (CALCIUM 600+D) 600-200 MG-UNIT TABS Take by mouth.    . levothyroxine (SYNTHROID, LEVOTHROID) 100 MCG tablet Take 1 tablet (100 mcg total) by mouth daily. 90 tablet 3  . metFORMIN (GLUCOPHAGE) 1000 MG tablet Take by mouth 1000 mg 1x a day with a meal 90 tablet 3  . Multiple Vitamins-Minerals (MULTIVITAMIN WITH MINERALS) tablet Take 1 tablet by mouth daily.    Marland Kitchen  Omega-3 Fatty Acids (FISH OIL) 1000 MG CAPS Take by mouth.    Marland Kitchen omeprazole (PRILOSEC) 20 MG capsule Take 1 capsule (20 mg total) by mouth daily for 14 days. 14 capsule 0  . vitamin B-12 (CYANOCOBALAMIN) 100 MCG tablet Take 100 mcg by mouth every other day.     No current facility-administered medications on file prior to visit.    Allergies  Allergen Reactions  . Other Other (See Comments) and Rash    Dermabond swelling/itching    Makes her skin burn  . Victoza [Liraglutide]   . Tape Other (See Comments)    Makes her skin burn. Can tolerate paper tape   Family History  Problem Relation Age of Onset  . Hypertension Mother   . Hypertension Father   . Diabetes Father    PE: LMP 08/27/2011  There is no height or weight on file to calculate BMI. Wt Readings from Last 3 Encounters:  12/02/17 185 lb (83.9 kg)  06/01/17 170 lb (77.1 kg)  04/22/17 174 lb (78.9 kg)   Constitutional:  in NAD  The physical exam was not performed (virtual visit).  ASSESSMENT: 1. DM2, insulin-independent, uncontrolled, without complications Now status post gastric sleeve surgery - ? PN  2. Hypothyroidism  3.  Weight gain  PLAN:  1. Patient  with long standing, prev. Uncontrolled DM2, with dramatic improvement in ctrl after her GBP sx. Since then, her sugars started to increase slightly as she gained weight. She is currently on only half maximal Metformin dose. - At this visit, sugars are slightly higher in am >> will increase Metformin to 2x a day.Now that she restarted walking , I think sugars will soon improve. - I suggested to:  Patient Instructions  Please increase: - Metformin 1000 mg 2x a day  Also continue: - Levothyroxine 100 mcg daily  Take the thyroid hormone every day, with water, at least 30 minutes before breakfast, separated by at least 4 hours from: - acid reflux medications - calcium - iron - multivitamins  Please return in 4 months.  -We will check her HbA1c when she returns to the clinic - continue checking sugars at different times of the day - check 1x a day, rotating checks - advised for yearly eye exams >> she is UTD - Return to clinic in 4 mo with sugar log      2. Hypothyroidism - latest thyroid labs reviewed with pt >> normal 11/2017 - she continues on LT4 100 Mcg daily - pt feels good on this dose. - we discussed about taking the thyroid hormone every day, with water, >30 minutes before breakfast, separated by >4 hours from acid reflux medications, calcium, iron, multivitamins. Pt. is taking it correctly. - will check thyroid tests at next visit  3.  Weight gain -She gained 15 pounds in the 6 months before last visit.  At that time, we discussed about improving her diet and including some form of exercise. Since last OV, she gained another 25 lbs after her mother passed away and she stopped exercise - she restarted 2 weeks ago.  She now gets home earlier from work after she changed her job place and has time to walk. -Continue metformin which is weight neutral  Philemon Kingdom, MD PhD [facility] Endocrinology

## 2018-06-02 NOTE — Patient Instructions (Addendum)
Please increase: - Metformin 1000 mg 2x a day  Also continue: - Levothyroxine 100 mcg daily  Take the thyroid hormone every day, with water, at least 30 minutes before breakfast, separated by at least 4 hours from: - acid reflux medications - calcium - iron - multivitamins  Please return in 4 months.

## 2018-06-06 ENCOUNTER — Ambulatory Visit: Payer: BLUE CROSS/BLUE SHIELD | Admitting: Internal Medicine

## 2018-12-13 ENCOUNTER — Encounter: Payer: Self-pay | Admitting: Internal Medicine

## 2018-12-14 MED ORDER — FLUCONAZOLE 150 MG PO TABS: 150.0000 mg | ORAL_TABLET | Freq: Once | ORAL | 0 refills | Status: AC

## 2019-02-02 ENCOUNTER — Other Ambulatory Visit: Payer: Self-pay | Admitting: Internal Medicine

## 2019-03-06 ENCOUNTER — Encounter: Payer: Self-pay | Admitting: Internal Medicine

## 2019-03-06 MED ORDER — FLUCONAZOLE 150 MG PO TABS: 150.0000 mg | ORAL_TABLET | Freq: Once | ORAL | 1 refills | Status: AC

## 2020-10-14 ENCOUNTER — Encounter: Payer: Self-pay | Admitting: Internal Medicine

## 2020-11-21 ENCOUNTER — Ambulatory Visit (INDEPENDENT_AMBULATORY_CARE_PROVIDER_SITE_OTHER): Payer: BLUE CROSS/BLUE SHIELD | Admitting: *Deleted

## 2020-11-21 ENCOUNTER — Other Ambulatory Visit: Payer: Self-pay

## 2020-11-21 VITALS — Ht 63.0 in | Wt 200.2 lb

## 2020-11-21 DIAGNOSIS — Z86010 Personal history of colonic polyps: Secondary | ICD-10-CM

## 2020-11-21 NOTE — Patient Instructions (Signed)
[place]   Patient Name:  CALAIS SVEHLA Date of procedure:  12/13/2020 Time to register at [facility] Stay:  6:30 AM Provider:  Dr. Abbey Chatters  Please notify us immediately if you are diabetic, take iron supplements, or if you are on coumadin or any blood thinners.  Please hold the following medications: See letter.  Note: Do NOT refrigerate or freeze CLENPIQ. CLENPIQ is ready to drink. There is no need to add any other liquid or mix the medicine in the bottle before you start dosing.   12/12/2020-  1 Day prior to procedure:    CLEAR LIQUIDS ALL DAY--NO SOLID FOODS OR DAIRY PRODUCTS! See list of liquids that are allowed and items that are NOT allowed below.  Diabetic Medication Instructions:  See letter.  You must drink plenty of CLEAR LIQUIDS starting before your bowel prep. It is important to stay adequately hydrated before, during, and after your bowel prep for the prep to work effectively!  At 4:00 PM Begin the prep as follows:    Drink one bottle of pre-mixed CLENPIQ right from the bottle.  Drink at least five (5) 8-ounce drinks of clear liquids of your choice within the next 5 hours  At 10:00 PM: Drink the second bottle of pre-mixed CLENPIQ right from the bottle.   Drink at least three (3) 8-ounce drinks of clear liquids of your choice within the next 3 hours before going to bed.  Continue clear liquids.    12/13/2020-  Day of Procedure:  Diabetic medications adjustments: See letter.  You may take TYLENOL products.  Please continue your regular medications unless we have instructed you otherwise.    At 3 hours before procedure @ 5:00 am: Stop drinking all liquids, nothing by mouth at this point.   Please note, on the day of your procedure you MUST be accompanied by an adult who is willing to assume responsibility for you at time of discharge. If you do not have such person with you, your procedure will have to be rescheduled.                                                                                                                      Please leave ALL jewelry at home prior to coming to the hospital for your procedure.   *It is your responsibility to check with your insurance company for the benefits of coverage you have for this procedure. Unfortunately, not all insurance companies have benefits to cover all or part of these types of procedures. It is your responsibility to check your benefits, however we will be glad to assist you with any codes your insurance company may need.   Please note that most insurance companies will not cover a screening colonoscopy for people under the age of 49  For example, with some insurance companies you may have benefits for a screening colonoscopy, but if polyps are found the diagnosis will change and then you may have a deductible that will need  to be met. Please make sure you check your benefits for screening colonoscopy as well as a diagnostic colonoscopy.    CLEAR LIQUIDS: (NO RED or PURPLE) Water  Jello   Apple Juice  White Grape Juice   Kool-Aid Soft drinks  Banana popsicles Sports Drink  Black coffee (No cream or milk) Tea (No cream or milk)  Broth (fat free beef/chicken/vegetable)  Clear liquids allow you to see your fingers on the other side of the glass.  Be sure they are NOT RED or PURPLE in color, cloudy, but CLEAR.  Do Not Eat: Dairy products of any kind Cranberry juice Tomato or V8 Juice  Orange Juice   Grapefruit Juice Red Grape Juice Alcohol   Non-dairy creamer Solid foods like cereal, oatmeal, yogurt, fruits, vegetables, creamed soups, eggs, bread, etc   HELPFUL HINTS TO MAKE DRINKING EASIER: -Trying drinking through a straw. -If you become nauseated, try consuming smaller amounts or stretch out the time between glasses.  Stop for 30 minutes & slowly start back drinking.  Call our office with any questions or concerns at [phone].  Thank You,  Christ Kick, [place]

## 2020-11-21 NOTE — Progress Notes (Signed)
Pt had gastric bypass Oct 2017.  Requested low volume prep due to this.  Provided pt with Clenpiq since her insurance would not cover the low volume kits.

## 2020-11-21 NOTE — Progress Notes (Signed)
Faxed release of information to Dr. Kennon Holter office.

## 2020-11-21 NOTE — Progress Notes (Addendum)
Gastroenterology Pre-Procedure Review  Request Date: 11/21/2020 Requesting Physician: Lanelle Bal, PA-C @ [facility], Last TCS done 01/21/2016 by Dr. Reather Laurence in [facility], tubular adenoma (X2), family hx of rectal cancer (aunt)  PATIENT REVIEW QUESTIONS: The patient responded to the following health history questions as indicated:    1. Diabetes Melitis: yes, type II 2. Joint replacements in the past 12 months: no 3. Major health problems in the past 3 months: no 4. Has an artificial valve or MVP: no 5. Has a defibrillator: no 6. Has been advised in past to take antibiotics in advance of a procedure like teeth cleaning: no 7. Family history of colon cancer: no, but aunt: age 80 had rectal cancer- has stoma  56. Alcohol Use: no 9. Illicit drug Use: no 10. History of sleep apnea: no  11. History of coronary artery or other vascular stents placed within the last 12 months: no 12. History of any prior anesthesia complications: no 13. Body mass index is 35.46 kg/m.    MEDICATIONS & ALLERGIES:    Patient reports the following regarding taking any blood thinners:   Plavix? no Aspirin? no Coumadin? no Brilinta? no Xarelto? no Eliquis? no Pradaxa? no Savaysa? no Effient? no  Patient confirms/reports the following medications:  Current Outpatient Medications  Medication Sig Dispense Refill   atorvastatin (LIPITOR) 20 MG tablet Take 20 mg by mouth daily.     Calcium Carbonate-Vitamin D 600-200 MG-UNIT TABS Take by mouth daily at 6 (six) AM.     ferrous sulfate 325 (65 FE) MG tablet Take 325 mg by mouth in the morning and at bedtime.     JARDIANCE 25 MG TABS tablet Take 25 mg by mouth daily.     levothyroxine (SYNTHROID) 100 MCG tablet Take 1 tablet by mouth once daily 90 tablet 0   metFORMIN (GLUCOPHAGE) 1000 MG tablet Take by mouth 1000 mg 2x a day with meals 188 tablet 3   TRULICITY 4.16 SA/6.3KZ SOPN Inject 0.75 mg into the skin once a week.     No current  facility-administered medications for this visit.    Patient confirms/reports the following allergies:  Allergies  Allergen Reactions   Other Other (See Comments) and Rash    Dermabond swelling/itching    Makes her skin burn   Victoza [Liraglutide]    Tape Other (See Comments)    Makes her skin burn. Can tolerate paper tape    No orders of the defined types were placed in this encounter.   AUTHORIZATION INFORMATION Primary Insurance: [place],  [place] #: [id number],  Group #: [id number] Pre-Cert / Auth required: No, not required per Con Pre-Cert / Auth #: [id number]  SCHEDULE INFORMATION: Procedure has been scheduled as follows:  Date: 12/13/2020, Time: 8:00  Location: [facility] with Dr. Abbey Chatters  This Gastroenterology Pre-Precedure Review Form is being routed to the following provider(s): Aliene Altes, PA-C

## 2020-11-21 NOTE — Progress Notes (Signed)
Angie, do we have the colonoscopy records and path for review?

## 2020-11-25 NOTE — Progress Notes (Signed)
Received records today 11/25/2020.  Will place in box for Aliene Altes, PA-C to review.

## 2020-11-28 ENCOUNTER — Telehealth: Payer: Self-pay | Admitting: *Deleted

## 2020-11-28 NOTE — Progress Notes (Signed)
Spoke to pt and made her aware of recommendations.  She advised me that Lanelle Bal, PA-C wanted her to have a colonoscopy due to low hemoglobin.  PCP has been managing care for low hemoglobin.  Placed records in [place], PA-C's box for review.

## 2020-11-28 NOTE — Telephone Encounter (Signed)
Lmom for pt to call me back. 

## 2020-11-28 NOTE — Progress Notes (Signed)
Reviewed colonoscopy records.  Colonoscopy completed 01/21/2016 with Dr. Reather Laurence. Impression: One 5 mm polyp in the sigmoid colon resected and retrieved, One 3 mm polyp in the cecum resected and retrieved. 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  Based on current guidelines, patients next colonoscopy should be in 7-10 years. She would not be due for colonoscopy until 2024.

## 2020-12-02 NOTE — Progress Notes (Signed)
Reviewed referral information from [facility].  Noted history of anemia with plans to repeat labs in 1 month.  Labs included with referral completed 10/04/2020 with hemoglobin 10.9 (L), MCV 77 (L), MCH 23.5 (L), MCHC 30.7 (L).  Recommend office visit to discuss anemia further prior to scheduling colonoscopy.

## 2020-12-02 NOTE — Progress Notes (Signed)
Pt informed that procedure scheduled for 12/13/2020 needed to be cancelled.  Informed pt that after provider review, that they feel it safest for her to come in and be seen to arrange TCS.  Scheduled pt for first available 01/15/2021 at 3:00 with Dr. Abbey Chatters.  Pt requested a sooner appt.  Informed her that she was added to wait list and we will call her if anything sooner.  Pt said that she is calling her PCP to inform them of what our plan is because she didn't think an ov should be required.  She said that PCP is managing her anemia.  Informed pt that since there is a problem (anemia) that we usually feel it is safest to address problem and schedule TCS afterwards. Pt said this didn't make sense to her and she would be contacting her PCP.  She did agree to ov in the meantime.

## 2020-12-02 NOTE — Progress Notes (Signed)
Communication noted.  

## 2021-01-15 ENCOUNTER — Ambulatory Visit: Payer: BC Managed Care – PPO | Admitting: Internal Medicine

## 2021-01-15 ENCOUNTER — Encounter: Payer: Self-pay | Admitting: Internal Medicine

## 2021-01-15 ENCOUNTER — Other Ambulatory Visit: Payer: Self-pay

## 2021-01-15 VITALS — BP 156/83 | HR 87 | Temp 97.7°F | Ht 63.0 in | Wt 197.0 lb

## 2021-01-15 DIAGNOSIS — D126 Benign neoplasm of colon, unspecified: Secondary | ICD-10-CM | POA: Diagnosis not present

## 2021-01-15 NOTE — Progress Notes (Signed)
Primary Care Physician:  Lanelle Bal, PA-C Primary Gastroenterologist:  Dr. Abbey Chatters  Chief Complaint  Patient presents with   Anemia    Had iron infusion today   Consult    Due for TCS. Last done 3 years ago, hx polyps    HPI:   Alicia Horne is a 55 y.o. female who presents to the clinic today by referral from her PCP Lanelle Bal to discuss surveillance colonoscopy.  Last colonoscopy 2017.  Had 2 polyps removed, both which were tubular adenomas.  Also with family history of rectal cancer in her aunt.  Patient denies any GI issues.  No melena hematochezia.  No abdominal pain.  No unintentional weight loss.  She is status post sleeve gastrectomy 5 years ago.  Does have iron deficiency related to this.  Hemoccult negative.  Past Medical History:  Diagnosis Date   Anemia    Back pain, chronic    Diabetes mellitus    Fibula fracture    left   Gallstones    Hyperlipidemia    Hypertension    no meds after weight loss   Hypothyroidism    Morbid obesity ([place])    Renal cell cancer ([place])    Renal mass, left     Past Surgical History:  Procedure Laterality Date   CESAREAN SECTION  1992 and 1998   CHOLECYSTECTOMY  07/03/2011   Procedure: LAPAROSCOPIC CHOLECYSTECTOMY WITH INTRAOPERATIVE CHOLANGIOGRAM;  Surgeon: Odis Hollingshead, MD;  Location: [facility];  Service: General;  Laterality: N/A;   ECTOPIC PREGNANCY SURGERY  1994   LAPAROSCOPIC GASTRIC SLEEVE RESECTION  09/24/2015   ORIF FIBULA FRACTURE Left 04/22/2017   Procedure: OPEN REDUCTION INTERNAL FIXATION (ORIF) LEFT FIBULA FRACTURE;  Surgeon: Hiram Gash, MD;  Location: [facility];  Service: Orthopedics;  Laterality: Left;   TUBAL LIGATION  1998    Current Outpatient Medications  Medication Sig Dispense Refill   atorvastatin (LIPITOR) 20 MG tablet Take 20 mg by mouth daily.     Calcium Carbonate-Vitamin D 600-200 MG-UNIT TABS Take by mouth daily at 6 (six) AM.     JARDIANCE 25 MG TABS tablet Take 25 mg  by mouth daily.     levothyroxine (SYNTHROID) 100 MCG tablet Take 1 tablet by mouth once daily 90 tablet 0   metFORMIN (GLUCOPHAGE) 1000 MG tablet Take by mouth 1000 mg 2x a day with meals 623 tablet 3   TRULICITY 7.62 GB/1.5VV SOPN Inject 0.75 mg into the skin once a week.     ferrous sulfate 325 (65 FE) MG tablet Take 325 mg by mouth in the morning and at bedtime. (Patient not taking: Reported on 01/15/2021)     No current facility-administered medications for this visit.    Allergies as of 01/15/2021 - Review Complete 01/15/2021  Allergen Reaction Noted   Other Other (See Comments) and Rash 06/24/2011   Victoza [liraglutide]  12/09/2012   Tape Other (See Comments) 06/24/2011    Family History  Problem Relation Age of Onset   Hypertension Mother    Hypertension Father    Diabetes Father     Social History   Socioeconomic History   Marital status: Married    Spouse name: Not on file   Number of children: Not on file   Years of education: Not on file   Highest education level: Not on file  Occupational History   Not on file  Tobacco Use   Smoking status: Never   Smokeless tobacco: Never  Substance and Sexual Activity   Alcohol use: No   Drug use: No   Sexual activity: Yes    Birth control/protection: Surgical    Comment: BTL  Other Topics Concern   Not on file  Social History Narrative   Not on file   Social Determinants of Health   Financial Resource Strain: Not on file  Food Insecurity: Not on file  Transportation Needs: Not on file  Physical Activity: Not on file  Stress: Not on file  Social Connections: Not on file  Intimate Partner Violence: Not on file    Subjective: Review of Systems  Constitutional:  Negative for chills and fever.  HENT:  Negative for congestion and hearing loss.   Eyes:  Negative for blurred vision and double vision.  Respiratory:  Negative for cough and shortness of breath.   Cardiovascular:  Negative for chest pain and  palpitations.  Gastrointestinal:  Negative for abdominal pain, blood in stool, constipation, diarrhea, heartburn, melena and vomiting.  Genitourinary:  Negative for dysuria and urgency.  Musculoskeletal:  Negative for joint pain and myalgias.  Skin:  Negative for itching and rash.  Neurological:  Negative for dizziness and headaches.  Psychiatric/Behavioral:  Negative for depression. The patient is not nervous/anxious.       Objective: BP (!) 156/83   Pulse 87   Temp 97.7 F (36.5 C)   Ht 5\' 3"  (1.6 m)   Wt 197 lb (89.4 kg)   LMP 08/27/2011   BMI 34.90 kg/m  Physical Exam Constitutional:      Appearance: Normal appearance.  HENT:     Head: Normocephalic and atraumatic.  Eyes:     Extraocular Movements: Extraocular movements intact.     Conjunctiva/sclera: Conjunctivae normal.  Cardiovascular:     Rate and Rhythm: Normal rate and regular rhythm.  Pulmonary:     Effort: Pulmonary effort is normal.     Breath sounds: Normal breath sounds.  Abdominal:     General: Bowel sounds are normal.     Palpations: Abdomen is soft.  Musculoskeletal:        General: No swelling. Normal range of motion.     Cervical back: Normal range of motion and neck supple.  Skin:    General: Skin is warm and dry.     Coloration: Skin is not jaundiced.  Neurological:     General: No focal deficit present.     Mental Status: She is alert and oriented to person, place, and time.  Psychiatric:        Mood and Affect: Mood normal.        Behavior: Behavior normal.     Assessment: *Adenomatous colon polyps  Plan: Will schedule for surveillance colonoscopy.The risks including infection, bleed, or perforation as well as benefits, limitations, alternatives and imponderables have been reviewed with the patient. Questions have been answered. All parties agreeable.   01/15/2021 3:00 PM   Disclaimer: This note was dictated with voice recognition software. Similar sounding words can inadvertently  be transcribed and may not be corrected upon review.

## 2021-01-15 NOTE — Patient Instructions (Signed)
We will schedule you for colonoscopy for surveillance purposes given your history of polyps.  Further recommendations to follow.  It was very nice meeting you today.  I hope you have a fantastic Thanksgiving.  Dr. Abbey Chatters  At [facility] we value your feedback. You may receive a survey about your visit today. Please share your experience as we strive to create trusting relationships with our patients to provide genuine, compassionate, quality care.  We appreciate your understanding and patience as we review any laboratory studies, imaging, and other diagnostic tests that are ordered as we care for you. Our office policy is 5 business days for review of these results, and any emergent or urgent results are addressed in a timely manner for your best interest. If you do not hear from our office in 1 week, please contact us.   We also encourage the use of MyChart, which contains your medical information for your review as well. If you are not enrolled in this feature, an access code is on this after visit summary for your convenience. Thank you for allowing [place] to be involved in your care.  It was great to see you today!  I hope you have a great rest of your Fall!    Elon Alas. Abbey Chatters, D.O. Gastroenterology and Hepatology [facility]

## 2021-02-18 ENCOUNTER — Other Ambulatory Visit: Payer: Self-pay

## 2021-02-18 ENCOUNTER — Ambulatory Visit (HOSPITAL_COMMUNITY): Payer: BC Managed Care – PPO | Admitting: Certified Registered"

## 2021-02-18 ENCOUNTER — Encounter (HOSPITAL_COMMUNITY): Payer: Self-pay

## 2021-02-18 ENCOUNTER — Ambulatory Visit (HOSPITAL_COMMUNITY)
Admission: RE | Admit: 2021-02-18 | Discharge: 2021-02-18 | Disposition: A | Discharge status: Home / Self Care | Payer: BC Managed Care – PPO | Attending: Internal Medicine | Admitting: Internal Medicine

## 2021-02-18 ENCOUNTER — Encounter (HOSPITAL_COMMUNITY)
Admission: RE | Disposition: A | Discharge status: Home / Self Care | Payer: Self-pay | Source: Home / Self Care | Attending: Internal Medicine

## 2021-02-18 DIAGNOSIS — I10 Essential (primary) hypertension: Secondary | ICD-10-CM | POA: Diagnosis not present

## 2021-02-18 DIAGNOSIS — Z800 Family history of malignant neoplasm of digestive organs: Secondary | ICD-10-CM | POA: Insufficient documentation

## 2021-02-18 DIAGNOSIS — E119 Type 2 diabetes mellitus without complications: Secondary | ICD-10-CM | POA: Diagnosis not present

## 2021-02-18 DIAGNOSIS — E039 Hypothyroidism, unspecified: Secondary | ICD-10-CM | POA: Insufficient documentation

## 2021-02-18 DIAGNOSIS — Z86010 Personal history of colonic polyps: Secondary | ICD-10-CM | POA: Diagnosis not present

## 2021-02-18 DIAGNOSIS — K648 Other hemorrhoids: Secondary | ICD-10-CM | POA: Insufficient documentation

## 2021-02-18 DIAGNOSIS — Z1211 Encounter for screening for malignant neoplasm of colon: Secondary | ICD-10-CM | POA: Insufficient documentation

## 2021-02-18 HISTORY — PX: COLONOSCOPY WITH PROPOFOL: SHX5780

## 2021-02-18 LAB — GLUCOSE, CAPILLARY: Glucose-Capillary: 114 mg/dL — ABNORMAL HIGH (ref 70–99)

## 2021-02-18 SURGERY — COLONOSCOPY WITH PROPOFOL
Anesthesia: General

## 2021-02-18 MED ORDER — LACTATED RINGERS IV SOLN: INTRAVENOUS | Status: DC

## 2021-02-18 MED ORDER — PROPOFOL 10 MG/ML IV BOLUS
INTRAVENOUS | Status: DC | PRN
  Administered 2021-02-18 (×2): 50 mg via INTRAVENOUS
  Administered 2021-02-18: 100 mg via INTRAVENOUS
  Administered 2021-02-18: 50 mg via INTRAVENOUS

## 2021-02-18 MED ORDER — LACTATED RINGERS IV SOLN: INTRAVENOUS | Status: DC | PRN

## 2021-02-18 MED ORDER — LIDOCAINE HCL (CARDIAC) PF 100 MG/5ML IV SOSY
PREFILLED_SYRINGE | INTRAVENOUS | Status: DC | PRN
  Administered 2021-02-18: 50 mg via INTRAVENOUS

## 2021-02-18 NOTE — Transfer of Care (Signed)
Immediate Anesthesia Transfer of Care Note  Patient: Alicia Horne  Procedure(s) Performed: COLONOSCOPY WITH PROPOFOL  Patient Location: Endoscopy Unit  Anesthesia Type:General  Level of Consciousness: drowsy  Airway & Oxygen Therapy: Patient Spontanous Breathing  Post-op Assessment: Report given to RN and Post -op Vital signs reviewed and stable  Post vital signs: Reviewed and stable  Last Vitals:  Vitals Value Taken Time  BP    Temp    Pulse    Resp    SpO2      Last Pain:  Vitals:   02/18/21 0847  TempSrc:   PainSc: 0-No pain      Patients Stated Pain Goal: 9 (59/29/24 4628)  Complications: No notable events documented.

## 2021-02-18 NOTE — H&P (Signed)
Primary Care Physician:  Lanelle Bal, PA-C Primary Gastroenterologist:  Dr. Abbey Chatters  Pre-Procedure History & Physical: HPI:  Alicia Horne is a 55 y.o. female is here  for surveillance colonoscopy.  Last colonoscopy 2017.  Had 2 polyps removed, both which were tubular adenomas.  Also with family history of rectal cancer in her aunt.  Patient denies any GI issues.  No melena hematochezia.  No abdominal pain.  No unintentional weight loss.  Past Medical History:  Diagnosis Date   Anemia    Back pain, chronic    Diabetes mellitus    Fibula fracture    left   Gallstones    Hyperlipidemia    Hypertension    no meds after weight loss   Hypothyroidism    Morbid obesity ([place])    Renal cell cancer ([place])    Renal mass, left     Past Surgical History:  Procedure Laterality Date   CESAREAN SECTION  1992 and 1998   CHOLECYSTECTOMY  07/03/2011   Procedure: LAPAROSCOPIC CHOLECYSTECTOMY WITH INTRAOPERATIVE CHOLANGIOGRAM;  Surgeon: Odis Hollingshead, MD;  Location: [facility];  Service: General;  Laterality: N/A;   ECTOPIC PREGNANCY SURGERY  1994   LAPAROSCOPIC GASTRIC SLEEVE RESECTION  09/24/2015   ORIF FIBULA FRACTURE Left 04/22/2017   Procedure: OPEN REDUCTION INTERNAL FIXATION (ORIF) LEFT FIBULA FRACTURE;  Surgeon: Hiram Gash, MD;  Location: [facility];  Service: Orthopedics;  Laterality: Left;   TUBAL LIGATION  1998    Prior to Admission medications   Medication Sig Start Date End Date Taking? Authorizing Provider  atorvastatin (LIPITOR) 20 MG tablet Take 20 mg by mouth every evening. 10/08/20  Yes [provider]  Cholecalciferol (VITAMIN D3 GUMMIES PO) Take 2 tablets by mouth in the morning.   Yes [provider]  JARDIANCE 25 MG TABS tablet Take 25 mg by mouth in the morning. 10/31/20  Yes [provider]  levothyroxine (SYNTHROID) 100 MCG tablet Take 1 tablet by mouth once daily 02/02/19  Yes Philemon Kingdom, MD  metFORMIN (GLUCOPHAGE) 1000  MG tablet Take by mouth 1000 mg 2x a day with meals 06/02/18  Yes Philemon Kingdom, MD  TRULICITY 2.84 XL/2.4MW SOPN Inject 0.75 mg into the skin every Sunday at 6pm. 10/31/20  Yes [provider]  ondansetron (ZOFRAN-ODT) 8 MG disintegrating tablet Take 8 mg by mouth every 8 (eight) hours as needed for nausea or vomiting.    [provider]  rizatriptan (MAXALT) 10 MG tablet Take 10 mg by mouth every 2 (two) hours as needed for migraine (max 2 tablets/24 hrs.). May repeat in 2 hours if needed    [provider]    Allergies as of 01/15/2021 - Review Complete 01/15/2021  Allergen Reaction Noted   Other Other (See Comments) and Rash 06/24/2011   Victoza [liraglutide]  12/09/2012   Tape Other (See Comments) 06/24/2011    Family History  Problem Relation Age of Onset   Hypertension Mother    Hypertension Father    Diabetes Father     Social History   Socioeconomic History   Marital status: Married    Spouse name: Not on file   Number of children: Not on file   Years of education: Not on file   Highest education level: Not on file  Occupational History   Not on file  Tobacco Use   Smoking status: Never   Smokeless tobacco: Never  Substance and Sexual Activity   Alcohol use: No   Drug  use: No   Sexual activity: Yes    Birth control/protection: Surgical    Comment: BTL  Other Topics Concern   Not on file  Social History Narrative   Not on file   Social Determinants of Health   Financial Resource Strain: Not on file  Food Insecurity: Not on file  Transportation Needs: Not on file  Physical Activity: Not on file  Stress: Not on file  Social Connections: Not on file  Intimate Partner Violence: Not on file    Review of Systems: See HPI, otherwise negative ROS  Physical Exam: Vital signs in last 24 hours: Temp:  [98.4 F (36.9 C)] 98.4 F (36.9 C) (12/27 0744) Pulse Rate:  [62] 62 (12/27 0744) Resp:  [21] 21 (12/27 0744) BP: (176)/(66)  176/66 (12/27 0744) SpO2:  [98 %] 98 % (12/27 0744) Weight:  [88.5 kg] 88.5 kg (12/27 0744)   General:   Alert,  Well-developed, well-nourished, pleasant and cooperative in NAD Head:  Normocephalic and atraumatic. Eyes:  Sclera clear, no icterus.   Conjunctiva pink. Ears:  Normal auditory acuity. Nose:  No deformity, discharge,  or lesions. Mouth:  No deformity or lesions, dentition normal. Neck:  Supple; no masses or thyromegaly. Lungs:  Clear throughout to auscultation.   No wheezes, crackles, or rhonchi. No acute distress. Heart:  Regular rate and rhythm; no murmurs, clicks, rubs,  or gallops. Abdomen:  Soft, nontender and nondistended. No masses, hepatosplenomegaly or hernias noted. Normal bowel sounds, without guarding, and without rebound.   Msk:  Symmetrical without gross deformities. Normal posture. Extremities:  Without clubbing or edema. Neurologic:  Alert and  oriented x4;  grossly normal neurologically. Skin:  Intact without significant lesions or rashes. Cervical Nodes:  No significant cervical adenopathy. Psych:  Alert and cooperative. Normal mood and affect.  Impression/Plan: Alicia Horne is here for surveillance colonoscopy.  Last colonoscopy 2017.  Had 2 polyps removed, both which were tubular adenomas.  Also with family history of rectal cancer in her aunt.  Patient denies any GI issues.  No melena hematochezia.  No abdominal pain.  No unintentional weight loss.  The risks of the procedure including infection, bleed, or perforation as well as benefits, limitations, alternatives and imponderables have been reviewed with the patient. Questions have been answered. All parties agreeable.

## 2021-02-18 NOTE — Discharge Instructions (Addendum)
°  Colonoscopy Discharge Instructions  Read the instructions outlined below and refer to this sheet in the next few weeks. These discharge instructions provide you with general information on caring for yourself after you leave the hospital. Your doctor may also give you specific instructions. While your treatment has been planned according to the most current medical practices available, unavoidable complications occasionally occur.   ACTIVITY You may resume your regular activity, but move at a slower pace for the next 24 hours.  Take frequent rest periods for the next 24 hours.  Walking will help get rid of the air and reduce the bloated feeling in your belly (abdomen).  No driving for 24 hours (because of the medicine (anesthesia) used during the test).   Do not sign any important legal documents or operate any machinery for 24 hours (because of the anesthesia used during the test).  NUTRITION Drink plenty of fluids.  You may resume your normal diet as instructed by your doctor.  Begin with a light meal and progress to your normal diet. Heavy or fried foods are harder to digest and may make you feel sick to your stomach (nauseated).  Avoid alcoholic beverages for 24 hours or as instructed.  MEDICATIONS You may resume your normal medications unless your doctor tells you otherwise.  WHAT YOU CAN EXPECT TODAY Some feelings of bloating in the abdomen.  Passage of more gas than usual.  Spotting of blood in your stool or on the toilet paper.  IF YOU HAD POLYPS REMOVED DURING THE COLONOSCOPY: No aspirin products for 7 days or as instructed.  No alcohol for 7 days or as instructed.  Eat a soft diet for the next 24 hours.  FINDING OUT THE RESULTS OF YOUR TEST Not all test results are available during your visit. If your test results are not back during the visit, make an appointment with your caregiver to find out the results. Do not assume everything is normal if you have not heard from your  caregiver or the medical facility. It is important for you to follow up on all of your test results.  SEEK IMMEDIATE MEDICAL ATTENTION IF: You have more than a spotting of blood in your stool.  Your belly is swollen (abdominal distention).  You are nauseated or vomiting.  You have a temperature over 101.  You have abdominal pain or discomfort that is severe or gets worse throughout the day.   Your colonoscopy was relatively unremarkable.  I did not find any polyps or evidence of colon cancer.  I recommend repeating colonoscopy in 10 years for colon cancer screening purposes.  Otherwise, follow-up with GI as needed.   I hope you have a great rest of your week!  Elon Alas. Abbey Chatters, D.O. Gastroenterology and Hepatology [facility]

## 2021-02-18 NOTE — Anesthesia Postprocedure Evaluation (Signed)
Anesthesia Post Note  Patient: Alicia Horne  Procedure(s) Performed: COLONOSCOPY WITH PROPOFOL  Patient location during evaluation: Phase II Anesthesia Type: General Level of consciousness: awake Pain management: pain level controlled Vital Signs Assessment: post-procedure vital signs reviewed and stable Respiratory status: spontaneous breathing and respiratory function stable Cardiovascular status: blood pressure returned to baseline and stable Postop Assessment: no headache and no apparent nausea or vomiting Anesthetic complications: no Comments: Late entry   No notable events documented.   Last Vitals:  Vitals:   02/18/21 0744 02/18/21 0903  BP: (!) 176/66 125/62  Pulse: 62   Resp: (!) 21 17  Temp: 36.9 C 36.5 C  SpO2: 98% 97%    Last Pain:  Vitals:   02/18/21 0903  TempSrc: Oral  PainSc: 0-No pain                 Louann Sjogren

## 2021-02-18 NOTE — Anesthesia Procedure Notes (Signed)
Date/Time: 02/18/2021 8:54 AM Performed by: Orlie Dakin, CRNA Pre-anesthesia Checklist: Patient identified, Emergency Drugs available, Suction available and Patient being monitored Patient Re-evaluated:Patient Re-evaluated prior to induction Oxygen Delivery Method: Nasal cannula Induction Type: IV induction Placement Confirmation: positive ETCO2

## 2021-02-18 NOTE — Anesthesia Preprocedure Evaluation (Signed)
Anesthesia Evaluation  Patient identified by MRN, date of birth, ID band Patient awake    Reviewed: Allergy & Precautions, H&P , NPO status , Patient's Chart, lab work & pertinent test results, reviewed documented beta blocker date and time   Airway Mallampati: II  TM Distance: >3 FB Neck ROM: full    Dental no notable dental hx.    Pulmonary neg pulmonary ROS,    Pulmonary exam normal breath sounds clear to auscultation       Cardiovascular Exercise Tolerance: Good hypertension, negative cardio ROS   Rhythm:regular Rate:Normal     Neuro/Psych negative neurological ROS  negative psych ROS   GI/Hepatic negative GI ROS, Neg liver ROS,   Endo/Other  diabetes, Type 2Hypothyroidism   Renal/GU negative Renal ROS  negative genitourinary   Musculoskeletal   Abdominal   Peds  Hematology  (+) Blood dyscrasia, anemia ,   Anesthesia Other Findings   Reproductive/Obstetrics negative OB ROS                             Anesthesia Physical Anesthesia Plan  ASA: 2  Anesthesia Plan: General   Post-op Pain Management:    Induction:   PONV Risk Score and Plan: Propofol infusion  Airway Management Planned:   Additional Equipment:   Intra-op Plan:   Post-operative Plan:   Informed Consent: I have reviewed the patients History and Physical, chart, labs and discussed the procedure including the risks, benefits and alternatives for the proposed anesthesia with the patient or authorized representative who has indicated his/her understanding and acceptance.     Dental Advisory Given  Plan Discussed with: CRNA  Anesthesia Plan Comments:         Anesthesia Quick Evaluation

## 2021-02-18 NOTE — Op Note (Signed)
[facility] Patient Name: Alicia Horne Procedure Date: 02/18/2021 8:32 AM MRN: [id number] Date of Birth: [birth year] Attending MD: Elon Alas. Abbey Chatters DO CSN: [id number] Age: 55 Admit Type: Outpatient Procedure:                Colonoscopy Indications:              Surveillance: Personal history of adenomatous                            polyps on last colonoscopy 5 years ago Providers:                Elon Alas. Abbey Chatters, DO, Lambert Mody, Aram Candela Referring MD:              Medicines:                See the Anesthesia note for documentation of the                            administered medications Complications:            No immediate complications. Estimated Blood Loss:     Estimated blood loss: none. Procedure:                Pre-Anesthesia Assessment:                           - The anesthesia plan was to use monitored                            anesthesia care (MAC).                           After obtaining informed consent, the colonoscope                            was passed under direct vision. Throughout the                            procedure, the patient's blood pressure, pulse, and                            oxygen saturations were monitored continuously. The                            [id number]-HQ190L ([id number]) scope was introduced through                            the anus and advanced to the the cecum, identified                            by appendiceal orifice and ileocecal valve. The                            colonoscopy was performed without difficulty. The  patient tolerated the procedure well. The quality                            of the bowel preparation was evaluated using the                            BBPS [facility] Bowel Preparation Scale) with scores                            of: Right Colon = 3, Transverse Colon = 3 and Left                            Colon = 3 (entire mucosa seen well with  no residual                            staining, small fragments of stool or opaque                            liquid). The total BBPS score equals 9. Scope In: 8:48:38 AM Scope Out: 9:00:05 AM Scope Withdrawal Time: 0 hours 7 minutes 35 seconds  Total Procedure Duration: 0 hours 11 minutes 27 seconds  Findings:      The perianal and digital rectal examinations were normal.      Non-bleeding internal hemorrhoids were found during endoscopy.      The exam was otherwise without abnormality. Impression:               - Non-bleeding internal hemorrhoids.                           - The examination was otherwise normal.                           - No specimens collected. Moderate Sedation:      Per Anesthesia Care Recommendation:           - Patient has a contact number available for                            emergencies. The signs and symptoms of potential                            delayed complications were discussed with the                            patient. Return to normal activities tomorrow.                            Written discharge instructions were provided to the                            patient.                           - Resume previous diet.                           -  Continue present medications.                           - Repeat colonoscopy in 10 years for screening                            purposes.                           - Return to GI clinic PRN. Procedure Code(s):        --- Professional ---                           G8185, Colorectal cancer screening; colonoscopy on                            individual at high risk Diagnosis Code(s):        --- Professional ---                           Z86.010, Personal history of colonic polyps                           K64.8, Other hemorrhoids CPT copyright 2019 [employer] Medical Association. All rights reserved. The codes documented in this report are preliminary and upon coder review may  be revised to meet  current compliance requirements. Elon Alas. Abbey Chatters, DO [place] Abbey Chatters, DO 02/18/2021 9:02:13 AM This report has been signed electronically. Number of Addenda: 0

## 2021-02-21 ENCOUNTER — Encounter (HOSPITAL_COMMUNITY): Payer: Self-pay | Admitting: Internal Medicine

## 2023-02-09 NOTE — Pre-Procedure Instructions (Signed)
Surgical Instructions   Your procedure is scheduled on February 19, 2023. Report to [facility] Main Entrance "A" at 5:30 A.M., then check in with the Admitting office. Any questions or running late day of surgery: call [phone]  Questions prior to your surgery date: call [phone], Monday-Friday, 8am-4pm. If you experience any cold or flu symptoms such as cough, fever, chills, shortness of breath, etc. between now and your scheduled surgery, please notify us at the above number.     Remember:  Do not eat after midnight the night before your surgery   You may drink clear liquids until 4:30 AM the morning of your surgery.   Clear liquids allowed are: Water, Non-Citrus Juices (without pulp), Carbonated Beverages, Clear Tea (no milk, honey, etc.), Black Coffee Only (NO MILK, CREAM OR POWDERED CREAMER of any kind), and Gatorade.    Take these medicines the morning of surgery with A SIP OF WATER: levothyroxine (SYNTHROID)  rosuvastatin (CRESTOR)    One week prior to surgery, STOP taking any Aspirin (unless otherwise instructed by your surgeon) Aleve, Naproxen, Ibuprofen, Motrin, Advil, Goody's, BC's, all herbal medications, fish oil, and non-prescription vitamins.   WHAT DO I DO ABOUT MY DIABETES MEDICATION?   Do not take metFORMIN (GLUCOPHAGE) the morning of surgery.  STOP taking your JARDIANCE three days prior to surgery. Your last dose will be December 23rd.      STOP taking your TRULICITY one week prior to surgery. DO NOT take any doses after December 19th.   HOW TO MANAGE YOUR DIABETES BEFORE AND AFTER SURGERY  Why is it important to control my blood sugar before and after surgery? Improving blood sugar levels before and after surgery helps healing and can limit problems. A way of improving blood sugar control is eating a healthy diet by:  Eating less sugar and carbohydrates  Increasing activity/exercise  Talking with your doctor about reaching your blood sugar  goals High blood sugars (greater than 180 mg/dL) can raise your risk of infections and slow your recovery, so you will need to focus on controlling your diabetes during the weeks before surgery. Make sure that the doctor who takes care of your diabetes knows about your planned surgery including the date and location.  How do I manage my blood sugar before surgery? Check your blood sugar at least 4 times a day, starting 2 days before surgery, to make sure that the level is not too high or low.  Check your blood sugar the morning of your surgery when you wake up and every 2 hours until you get to the Short Stay unit.  If your blood sugar is less than 70 mg/dL, you will need to treat for low blood sugar: Do not take insulin. Treat a low blood sugar (less than 70 mg/dL) with  cup of clear juice (cranberry or apple), 4 glucose tablets, OR glucose gel. Recheck blood sugar in 15 minutes after treatment (to make sure it is greater than 70 mg/dL). If your blood sugar is not greater than 70 mg/dL on recheck, call [phone] for further instructions. Report your blood sugar to the short stay nurse when you get to Short Stay.  If you are admitted to the hospital after surgery: Your blood sugar will be checked by the staff and you will probably be given insulin after surgery (instead of oral diabetes medicines) to make sure you have good blood sugar levels. The goal for blood sugar control after surgery is 80-180 mg/dL.  Do NOT Smoke (Tobacco/Vaping) for 24 hours prior to your procedure.  If you use a CPAP at night, you may bring your mask/headgear for your overnight stay.   You will be asked to remove any contacts, glasses, piercing's, hearing aid's, dentures/partials prior to surgery. Please bring cases for these items if needed.    Patients discharged the day of surgery will not be allowed to drive home, and someone needs to stay with them for 24 hours.  SURGICAL WAITING  ROOM VISITATION Patients may have no more than 2 support people in the waiting area - these visitors may rotate.   Pre-op nurse will coordinate an appropriate time for 1 ADULT support person, who may not rotate, to accompany patient in pre-op.  Children under the age of 59 must have an adult with them who is not the patient and must remain in the main waiting area with an adult.  If the patient needs to stay at the hospital during part of their recovery, the visitor guidelines for inpatient rooms apply.  Please refer to the [facility] website for the visitor guidelines for any additional information.   If you received a COVID test during your pre-op visit  it is requested that you wear a mask when out in public, stay away from anyone that may not be feeling well and notify your surgeon if you develop symptoms. If you have been in contact with anyone that has tested positive in the last 10 days please notify you surgeon.      Pre-operative CHG Bathing Instructions   You can play a key role in reducing the risk of infection after surgery. Your skin needs to be as free of germs as possible. You can reduce the number of germs on your skin by washing with CHG (chlorhexidine gluconate) soap before surgery. CHG is an antiseptic soap that kills germs and continues to kill germs even after washing.   DO NOT use if you have an allergy to chlorhexidine/CHG or antibacterial soaps. If your skin becomes reddened or irritated, stop using the CHG and notify one of our RNs at [phone].              TAKE A SHOWER THE NIGHT BEFORE SURGERY AND THE DAY OF SURGERY    Please keep in mind the following:  DO NOT shave, including legs and underarms, 48 hours prior to surgery.   You may shave your face before/day of surgery.  Place clean sheets on your bed the night before surgery Use a clean washcloth (not used since being washed) for each shower. DO NOT sleep with pet's night before surgery.  CHG Shower  Instructions:  Wash your face and private area with normal soap. If you choose to wash your hair, wash first with your normal shampoo.  After you use shampoo/soap, rinse your hair and body thoroughly to remove shampoo/soap residue.  Turn the water OFF and apply half the bottle of CHG soap to a CLEAN washcloth.  Apply CHG soap ONLY FROM YOUR NECK DOWN TO YOUR TOES (washing for 3-5 minutes)  DO NOT use CHG soap on face, private areas, open wounds, or sores.  Pay special attention to the area where your surgery is being performed.  If you are having back surgery, having someone wash your back for you may be helpful. Wait 2 minutes after CHG soap is applied, then you may rinse off the CHG soap.  Pat dry with a clean towel  Put on clean pajamas  Additional instructions for the day of surgery: DO NOT APPLY any lotions, deodorants, cologne, or perfumes.   Do not wear jewelry or makeup Do not wear nail polish, gel polish, artificial nails, or any other type of covering on natural nails (fingers and toes) Do not bring valuables to the hospital. [facility] is not responsible for valuables/personal belongings. Put on clean/comfortable clothes.  Please brush your teeth.  Ask your nurse before applying any prescription medications to the skin.

## 2023-02-10 ENCOUNTER — Other Ambulatory Visit: Payer: Self-pay

## 2023-02-10 ENCOUNTER — Encounter (HOSPITAL_COMMUNITY)
Admission: RE | Admit: 2023-02-10 | Discharge: 2023-02-10 | Disposition: A | Discharge status: Home / Self Care | Payer: Commercial Managed Care - PPO | Source: Ambulatory Visit | Attending: Plastic Surgery | Admitting: Plastic Surgery

## 2023-02-10 ENCOUNTER — Encounter (HOSPITAL_COMMUNITY): Payer: Self-pay

## 2023-02-10 VITALS — BP 155/75 | HR 65 | Temp 98.3°F | Resp 17 | Ht 63.0 in | Wt 167.6 lb

## 2023-02-10 DIAGNOSIS — E119 Type 2 diabetes mellitus without complications: Secondary | ICD-10-CM | POA: Insufficient documentation

## 2023-02-10 DIAGNOSIS — E039 Hypothyroidism, unspecified: Secondary | ICD-10-CM | POA: Diagnosis not present

## 2023-02-10 DIAGNOSIS — Z01818 Encounter for other preprocedural examination: Secondary | ICD-10-CM | POA: Diagnosis present

## 2023-02-10 DIAGNOSIS — E785 Hyperlipidemia, unspecified: Secondary | ICD-10-CM | POA: Diagnosis not present

## 2023-02-10 DIAGNOSIS — Z905 Acquired absence of kidney: Secondary | ICD-10-CM | POA: Diagnosis not present

## 2023-02-10 DIAGNOSIS — C642 Malignant neoplasm of left kidney, except renal pelvis: Secondary | ICD-10-CM | POA: Insufficient documentation

## 2023-02-10 DIAGNOSIS — M793 Panniculitis, unspecified: Secondary | ICD-10-CM | POA: Diagnosis not present

## 2023-02-10 DIAGNOSIS — I10 Essential (primary) hypertension: Secondary | ICD-10-CM | POA: Insufficient documentation

## 2023-02-10 DIAGNOSIS — R9431 Abnormal electrocardiogram [ECG] [EKG]: Secondary | ICD-10-CM | POA: Insufficient documentation

## 2023-02-10 DIAGNOSIS — Z9049 Acquired absence of other specified parts of digestive tract: Secondary | ICD-10-CM | POA: Diagnosis not present

## 2023-02-10 HISTORY — DX: Headache, unspecified: R51.9

## 2023-02-10 LAB — GLUCOSE, CAPILLARY: Glucose-Capillary: 154 mg/dL — ABNORMAL HIGH (ref 70–99)

## 2023-02-10 NOTE — Progress Notes (Signed)
PCP - Lianne Moris, PA-C Cardiologist - Denies  PPM/ICD - Denies Device Orders - n/a Rep Notified - n/a  Chest x-ray - n/a EKG - 02/10/2023 Stress Test - Denies ECHO - Denies Cardiac Cath - Denies  Sleep Study - Denies CPAP - n/a  Pt is DM2. She checks her blood sugar daily. Normal fasting range is 80-90s. CBG at pre-op 154. Last A1c 6.3 on 02/05/23 (result in chart)  Last dose of GLP1 agonist- Last dose of Trulicity December 15th. GLP1 instructions: Pt instructed to NOT take her next dose on December 22nd  Blood Thinner Instructions: n/a Aspirin Instructions: n/a  ERAS Protcol - Clear liquids until 0430 morning of surgery PRE-SURGERY Ensure or G2- n/a  COVID TEST- n/a   Anesthesia review: Yes. EKG review  Patient denies shortness of breath, fever, cough and chest pain at PAT appointment. Pt denies any respiratory illness/infection in the last two months   All instructions explained to the patient, with a verbal understanding of the material. Patient agrees to go over the instructions while at home for a better understanding. Patient also instructed to self quarantine after being tested for COVID-19. The opportunity to ask questions was provided.

## 2023-02-10 NOTE — H&P (Signed)
bjective Patient ID: Alicia Horne is a 57 y.o. female.     HPI   Returns for follow up panniculectomy. Highest weight 400 lb. Underwent sleeve gastroplasty 2017. Lowest weight following this 150 lb. Stable at current weight for 2-3 years. Reports recurrent infection excoriation beneath skin fold abdomen and in umbilicus for over three years worse in warm months. This has not improved with trial hygiene measures, topical and oral nystatin for over 6 month trial.    PMH significant for anemia followed by Heme/Onc Hb  13.8 10/2022, DM HbA1c 6.7 10/28/2024019 on metformin and Jardiance and Trulicity. She also has had partial nephrectomy for cancer. Reports avoids all NSAIDs due to gastroplasty.   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 Lives with spouse.      Review of Systems  Constitutional:  Positive for fatigue.  Musculoskeletal:  Positive for back pain.  Skin:  Positive for rash.  All other systems reviewed and are negative.     Objective Physical Exam  Cardiovascular: Normal rate, regular rhythm and normal heart sounds.    Pulmonary/Chest Effort normal and breath sounds normal.    Skin   Fitzpatrick 2    Abd: no hernias, panniculus present group 2 that hangs below symphysis pubis   Assessment/Plan Panniculitis   Plan panniculectomy. Hold Trulicity week prior to surgery.   Reviewed panniculectomy vs abdominoplasty. Reviewed panniculectomy is not a cosmetic procedure. Reviewed changes with aging, wt gain/loss. Reviewed possible overnight stay, drains, post op limitations. Reviewed scar maturation over months. Reviewed expected area scars area soft tissue resection and expected elevation mons in mirror. Counseled will not change contour mons, will not affect back or thighs. Reviewed panniculectomy will not affect contour upper abdomen. We discussed option resection umbilicus if desired with panniculectomy. She continues  to consider this, leaning toward umbilical transposition.   Additional risks including but not limited to bleeding, hematoma, seroma, damage to adjacent structures, need for additional procedures, dog ears, infection, wound healing problems, asymmetry, unacceptable cosmetic result, blood clots in legs or lungs reviewed. Completed ASPS consent for panniculectomy.    Drain teaching completed. Rx for Oxycodone given. Patient has allergy to Dermabond.    Glenna Fellows, MD [facility] Plastic & Reconstructive Surgery  Office/ physician access line after hours [phone]

## 2023-02-11 NOTE — Progress Notes (Signed)
Anesthesia Chart Review:  Case: [id number] Date/Time: 02/19/23 0715   Procedure: PANNICULECTOMY (Abdomen)   Anesthesia type: General   Pre-op diagnosis: Panniculitis history bariatric surgery   [facility] ROOM 08 / [facility] OR   Surgeons: Glenna Fellows, MD       DISCUSSION: Patient is a 57 year old female scheduled for the above procedure.  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 Results include: Na 138, K 3.4, glucose 119, AST 10, ALT 16, BUN 15, Cr 0.83, A1c 6.3%. Copy on shadow chart. She also had a CBC on 01/28/23 with [facility] (see CE) that showed WBC 9.0, H/H 14.3/43.7, PLT 306.  Last IV iron was in 11/2022.   A1c 6.3% on 02/05/23. She is on Jardiance, Trulicity, metformin. Last Trulicity 02/07/23. Last Jardiance planned for 02/15/23.  Anesthesia team to evaluate on the day of surgery.   VS: BP (!) 155/75   Pulse 65   Temp 36.8 C   Resp 17   Ht 5\' 3"  (1.6 m)   Wt 76 kg   LMP 06/18/2011 Comment: had BTL  SpO2 98%   BMI 29.69 kg/m    PROVIDERS: Lianne Moris, PA-C is PCP Nada Boozer, MD is HEM   LABS:  She brought in labs from [facility] from 02/05/23. Results include: Na 138, K 3.4, glucose 119, AST 10, ALT 16, BUN 15, Cr 0.83, A1c 6.3%. Copy on shadow chart. She also had a CBC on 01/28/23 with [facility] (see CE) that showed WBC 9.0, H/H 14.3/43.7, PLT 306.    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  Ferritin infusions   Back pain, chronic    Diabetes mellitus    Type 2   Fibula fracture    left   Gallstones    Headache    Migraines   Hyperlipidemia    Hypertension    no meds after weight loss   Hypothyroidism    Morbid obesity (HCC)    Renal cell cancer (HCC) 2013   Left Partial Nephrectomy   Renal mass, left     Past Surgical History:  Procedure Laterality Date   CESAREAN SECTION  1992 and 1998   CHOLECYSTECTOMY  07/03/2011   Procedure: LAPAROSCOPIC CHOLECYSTECTOMY WITH INTRAOPERATIVE CHOLANGIOGRAM;  Surgeon: Adolph Pollack, MD;  Location: [facility] ORS;  Service: General;  Laterality: N/A;   COLONOSCOPY WITH PROPOFOL N/A 02/18/2021   Procedure: COLONOSCOPY WITH PROPOFOL;  Surgeon: Lanelle Bal, DO;  Location: [facility] SUITE;  Service: Endoscopy;  Laterality: N/A;  9:00am   ECTOPIC PREGNANCY SURGERY  1994   LAPAROSCOPIC GASTRIC SLEEVE RESECTION  09/24/2015   ORIF FIBULA FRACTURE Left 04/22/2017   Procedure: OPEN REDUCTION INTERNAL FIXATION (ORIF) LEFT FIBULA FRACTURE;  Surgeon: Bjorn Pippin, MD;  Location: [facility];  Service: Orthopedics;  Laterality: Left;   PARTIAL NEPHRECTOMY Left 07/03/2011   Removed during gallbladder surgery   TUBAL LIGATION  1998    MEDICATIONS:  Ascorbic Acid (VITAMIN C PO)  b complex vitamins capsule   Cholecalciferol (VITAMIN D3 GUMMIES PO)   JARDIANCE 25 MG TABS tablet   KLOR-CON M20 20 MEQ tablet   levothyroxine (SYNTHROID) 100 MCG tablet   metFORMIN (GLUCOPHAGE) 1000 MG tablet   Prenatal Multivit-Min-Fe-FA (PRE-NATAL PO)   rosuvastatin (CRESTOR) 20 MG tablet   TRULICITY 0.75 MG/0.5ML SOPN   UBRELVY 50 MG TABS   No current facility-administered medications for this encounter.    Shonna Chock, PA-C Surgical Short Stay/Anesthesiology [facility] Phone [phone] [facility] Phone [phone] 02/11/2023 10:23 PM

## 2023-02-11 NOTE — Anesthesia Preprocedure Evaluation (Addendum)
Anesthesia Evaluation  Patient identified by MRN, date of birth, ID band Patient awake    Reviewed: Allergy & Precautions, H&P , NPO status , Patient's Chart, lab work & pertinent test results  Airway Mallampati: II  TM Distance: >3 FB Neck ROM: Full    Dental no notable dental hx.    Pulmonary neg pulmonary ROS   Pulmonary exam normal breath sounds clear to auscultation       Cardiovascular Normal cardiovascular exam Rhythm:Regular Rate:Normal     Neuro/Psych negative neurological ROS  negative psych ROS   GI/Hepatic negative GI ROS, Neg liver ROS,,,  Endo/Other  diabetes, Type 2Hypothyroidism    Renal/GU negative Renal ROS  negative genitourinary   Musculoskeletal negative musculoskeletal ROS (+)    Abdominal   Peds negative pediatric ROS (+)  Hematology negative hematology ROS (+)   Anesthesia Other Findings   Reproductive/Obstetrics negative OB ROS                             Anesthesia Physical Anesthesia Plan  ASA: 2  Anesthesia Plan: General   Post-op Pain Management: Tylenol PO (pre-op)*   Induction: Intravenous  PONV Risk Score and Plan: 3 and Ondansetron, Dexamethasone, Midazolam and Treatment may vary due to age or medical condition  Airway Management Planned: Oral ETT  Additional Equipment:   Intra-op Plan:   Post-operative Plan: Extubation in OR  Informed Consent: I have reviewed the patients History and Physical, chart, labs and discussed the procedure including the risks, benefits and alternatives for the proposed anesthesia with the patient or authorized representative who has indicated his/her understanding and acceptance.     Dental advisory given  Plan Discussed with: CRNA and Surgeon  Anesthesia Plan Comments: (PAT note written 02/11/2023 by Shonna Chock, PA-C.  )       Anesthesia Quick Evaluation

## 2023-02-19 ENCOUNTER — Ambulatory Visit (HOSPITAL_BASED_OUTPATIENT_CLINIC_OR_DEPARTMENT_OTHER): Payer: Commercial Managed Care - PPO | Admitting: Certified Registered Nurse Anesthetist

## 2023-02-19 ENCOUNTER — Ambulatory Visit (HOSPITAL_COMMUNITY): Payer: Commercial Managed Care - PPO | Admitting: Vascular Surgery

## 2023-02-19 ENCOUNTER — Encounter (HOSPITAL_COMMUNITY): Payer: Self-pay | Admitting: Plastic Surgery

## 2023-02-19 ENCOUNTER — Encounter (HOSPITAL_COMMUNITY)
Admission: RE | Disposition: A | Discharge status: Home / Self Care | Payer: Self-pay | Source: Ambulatory Visit | Attending: Plastic Surgery

## 2023-02-19 ENCOUNTER — Other Ambulatory Visit: Payer: Self-pay

## 2023-02-19 ENCOUNTER — Ambulatory Visit (HOSPITAL_COMMUNITY)
Admission: RE | Admit: 2023-02-19 | Discharge: 2023-02-19 | Disposition: A | Discharge status: Home / Self Care | Payer: Commercial Managed Care - PPO | Source: Ambulatory Visit | Attending: Plastic Surgery | Admitting: Plastic Surgery

## 2023-02-19 DIAGNOSIS — Z7984 Long term (current) use of oral hypoglycemic drugs: Secondary | ICD-10-CM | POA: Diagnosis not present

## 2023-02-19 DIAGNOSIS — M793 Panniculitis, unspecified: Secondary | ICD-10-CM | POA: Diagnosis present

## 2023-02-19 DIAGNOSIS — E119 Type 2 diabetes mellitus without complications: Secondary | ICD-10-CM | POA: Diagnosis not present

## 2023-02-19 DIAGNOSIS — Z905 Acquired absence of kidney: Secondary | ICD-10-CM | POA: Diagnosis not present

## 2023-02-19 DIAGNOSIS — Z9884 Bariatric surgery status: Secondary | ICD-10-CM | POA: Insufficient documentation

## 2023-02-19 HISTORY — PX: PANNICULECTOMY: SHX5360

## 2023-02-19 LAB — GLUCOSE, CAPILLARY
Glucose-Capillary: 113 mg/dL — ABNORMAL HIGH (ref 70–99)
Glucose-Capillary: 177 mg/dL — ABNORMAL HIGH (ref 70–99)

## 2023-02-19 SURGERY — PANNICULECTOMY
Anesthesia: General | Site: Abdomen

## 2023-02-19 MED ORDER — HYDROMORPHONE HCL 1 MG/ML IJ SOLN
INTRAMUSCULAR | Status: AC
  Administered 2023-02-19: 0.5 mg via INTRAVENOUS
  Filled 2023-02-19: qty 1

## 2023-02-19 MED ORDER — ONDANSETRON HCL 4 MG/2ML IJ SOLN
INTRAMUSCULAR | Status: DC | PRN
  Administered 2023-02-19: 4 mg via INTRAVENOUS

## 2023-02-19 MED ORDER — CHLORHEXIDINE GLUCONATE CLOTH 2 % EX PADS: 6.0000 | MEDICATED_PAD | Freq: Once | CUTANEOUS | Status: DC

## 2023-02-19 MED ORDER — ONDANSETRON HCL 4 MG/2ML IJ SOLN
INTRAMUSCULAR | Status: AC
  Filled 2023-02-19: qty 2

## 2023-02-19 MED ORDER — HYDROMORPHONE HCL 1 MG/ML IJ SOLN
0.2500 mg | INTRAMUSCULAR | Status: DC | PRN
Start: 2023-02-19 — End: 2023-02-19

## 2023-02-19 MED ORDER — ROCURONIUM BROMIDE 10 MG/ML (PF) SYRINGE
PREFILLED_SYRINGE | INTRAVENOUS | Status: AC
  Filled 2023-02-19: qty 10

## 2023-02-19 MED ORDER — CEFAZOLIN SODIUM-DEXTROSE 2-4 GM/100ML-% IV SOLN
2.0000 g | INTRAVENOUS | Status: AC
  Administered 2023-02-19: 2 g via INTRAVENOUS
  Filled 2023-02-19: qty 100

## 2023-02-19 MED ORDER — MIDAZOLAM HCL 2 MG/2ML IJ SOLN
INTRAMUSCULAR | Status: DC | PRN
  Administered 2023-02-19: 2 mg via INTRAVENOUS

## 2023-02-19 MED ORDER — LACTATED RINGERS IV SOLN: INTRAVENOUS | Status: DC

## 2023-02-19 MED ORDER — KETAMINE HCL 50 MG/5ML IJ SOSY
PREFILLED_SYRINGE | INTRAMUSCULAR | Status: AC
  Filled 2023-02-19: qty 5

## 2023-02-19 MED ORDER — DEXAMETHASONE SODIUM PHOSPHATE 10 MG/ML IJ SOLN
INTRAMUSCULAR | Status: DC | PRN
  Administered 2023-02-19: 10 mg via INTRAVENOUS

## 2023-02-19 MED ORDER — PROPOFOL 10 MG/ML IV BOLUS
INTRAVENOUS | Status: AC
  Filled 2023-02-19: qty 20

## 2023-02-19 MED ORDER — KETOROLAC TROMETHAMINE 30 MG/ML IJ SOLN: 30.0000 mg | Freq: Once | INTRAMUSCULAR | Status: AC | PRN

## 2023-02-19 MED ORDER — AMISULPRIDE (ANTIEMETIC) 5 MG/2ML IV SOLN
10.0000 mg | Freq: Once | INTRAVENOUS | Status: AC
  Administered 2023-02-19: 10 mg via INTRAVENOUS

## 2023-02-19 MED ORDER — DEXAMETHASONE SODIUM PHOSPHATE 10 MG/ML IJ SOLN
INTRAMUSCULAR | Status: AC
  Filled 2023-02-19: qty 1

## 2023-02-19 MED ORDER — DEXMEDETOMIDINE HCL IN NACL 80 MCG/20ML IV SOLN
INTRAVENOUS | Status: AC
  Filled 2023-02-19: qty 20

## 2023-02-19 MED ORDER — KETOROLAC TROMETHAMINE 30 MG/ML IJ SOLN
INTRAMUSCULAR | Status: AC
  Administered 2023-02-19: 30 mg via INTRAVENOUS
  Filled 2023-02-19: qty 1

## 2023-02-19 MED ORDER — LIDOCAINE 2% (20 MG/ML) 5 ML SYRINGE
INTRAMUSCULAR | Status: AC
  Filled 2023-02-19: qty 5

## 2023-02-19 MED ORDER — FENTANYL CITRATE (PF) 250 MCG/5ML IJ SOLN
INTRAMUSCULAR | Status: DC | PRN
  Administered 2023-02-19 (×3): 50 ug via INTRAVENOUS
  Administered 2023-02-19: 100 ug via INTRAVENOUS

## 2023-02-19 MED ORDER — SUGAMMADEX SODIUM 200 MG/2ML IV SOLN
INTRAVENOUS | Status: DC | PRN
  Administered 2023-02-19: 100 mg via INTRAVENOUS

## 2023-02-19 MED ORDER — FENTANYL CITRATE (PF) 250 MCG/5ML IJ SOLN
INTRAMUSCULAR | Status: AC
  Filled 2023-02-19: qty 5

## 2023-02-19 MED ORDER — ACETAMINOPHEN 500 MG PO TABS
1000.0000 mg | ORAL_TABLET | ORAL | Status: AC
  Administered 2023-02-19: 1000 mg via ORAL
  Filled 2023-02-19: qty 2

## 2023-02-19 MED ORDER — AMISULPRIDE (ANTIEMETIC) 5 MG/2ML IV SOLN
INTRAVENOUS | Status: AC
  Filled 2023-02-19: qty 4

## 2023-02-19 MED ORDER — BUPIVACAINE HCL (PF) 0.25 % IJ SOLN
INTRAMUSCULAR | Status: AC
  Filled 2023-02-19: qty 30

## 2023-02-19 MED ORDER — 0.9 % SODIUM CHLORIDE (POUR BTL) OPTIME
TOPICAL | Status: DC | PRN
  Administered 2023-02-19: 1000 mL

## 2023-02-19 MED ORDER — DEXMEDETOMIDINE HCL IN NACL 80 MCG/20ML IV SOLN
INTRAVENOUS | Status: DC | PRN
  Administered 2023-02-19 (×5): 4 ug via INTRAVENOUS

## 2023-02-19 MED ORDER — LIDOCAINE HCL (PF) 2 % IJ SOLN
INTRAMUSCULAR | Status: DC | PRN
  Administered 2023-02-19: 100 mg via INTRADERMAL

## 2023-02-19 MED ORDER — PROPOFOL 10 MG/ML IV BOLUS
INTRAVENOUS | Status: DC | PRN
  Administered 2023-02-19: 150 mg via INTRAVENOUS

## 2023-02-19 MED ORDER — CHLORHEXIDINE GLUCONATE 0.12 % MT SOLN
15.0000 mL | Freq: Once | OROMUCOSAL | Status: AC
  Administered 2023-02-19: 15 mL via OROMUCOSAL
  Filled 2023-02-19: qty 15

## 2023-02-19 MED ORDER — BUPIVACAINE HCL (PF) 0.25 % IJ SOLN
INTRAMUSCULAR | Status: DC | PRN
  Administered 2023-02-19: 30 mL

## 2023-02-19 MED ORDER — ORAL CARE MOUTH RINSE: 15.0000 mL | Freq: Once | OROMUCOSAL | Status: AC

## 2023-02-19 MED ORDER — KETAMINE HCL 50 MG/5ML IJ SOSY
PREFILLED_SYRINGE | INTRAMUSCULAR | Status: DC | PRN
  Administered 2023-02-19: 20 mg via INTRAVENOUS
  Administered 2023-02-19: 10 mg via INTRAVENOUS
  Administered 2023-02-19: 20 mg via INTRAVENOUS

## 2023-02-19 MED ORDER — ONDANSETRON HCL 4 MG/2ML IJ SOLN
4.0000 mg | Freq: Once | INTRAMUSCULAR | Status: AC | PRN
  Administered 2023-02-19: 4 mg via INTRAVENOUS

## 2023-02-19 MED ORDER — MIDAZOLAM HCL 2 MG/2ML IJ SOLN
INTRAMUSCULAR | Status: AC
Start: 2023-02-19 — End: ?
  Filled 2023-02-19: qty 2

## 2023-02-19 MED ORDER — ROCURONIUM BROMIDE 10 MG/ML (PF) SYRINGE
PREFILLED_SYRINGE | INTRAVENOUS | Status: DC | PRN
  Administered 2023-02-19: 70 mg via INTRAVENOUS
  Administered 2023-02-19: 10 mg via INTRAVENOUS
  Administered 2023-02-19: 20 mg via INTRAVENOUS

## 2023-02-19 MED ORDER — TRANEXAMIC ACID-NACL 1000-0.7 MG/100ML-% IV SOLN
1000.0000 mg | INTRAVENOUS | Status: DC
  Filled 2023-02-19: qty 100

## 2023-02-19 SURGICAL SUPPLY — 46 items
APPLIER CLIP 9.375 MED OPEN (MISCELLANEOUS) ×3
BINDER ABDOMINAL 12 ML 46-62 (SOFTGOODS) ×2 IMPLANT
BLADE SURG 10 STRL SS (BLADE) ×4 IMPLANT
BLADE SURG 11 STRL SS (BLADE) ×2 IMPLANT
BLADE SURG 15 STRL LF DISP TIS (BLADE) IMPLANT
CHLORAPREP W/TINT 26 (MISCELLANEOUS) ×2 IMPLANT
CLIP APPLIE 9.375 MED OPEN (MISCELLANEOUS) ×2 IMPLANT
DRAIN CHANNEL 15F RND FF W/TCR (WOUND CARE) ×4 IMPLANT
DRAIN CHANNEL 19F RND (DRAIN) IMPLANT
DRAPE HALF SHEET 40X57 (DRAPES) ×4 IMPLANT
DRAPE TOP ARMCOVERS (MISCELLANEOUS) ×2 IMPLANT
DRAPE U-SHAPE 76X120 STRL (DRAPES) ×2 IMPLANT
DRAPE UTILITY XL STRL (DRAPES) ×2 IMPLANT
ELECT BLADE 4.0 EZ CLEAN MEGAD (MISCELLANEOUS) ×1
ELECT COATED BLADE 2.86 ST (ELECTRODE) ×2 IMPLANT
ELECT REM PT RETURN 9FT ADLT (ELECTROSURGICAL) ×1
ELECTRODE BLDE 4.0 EZ CLN MEGD (MISCELLANEOUS) IMPLANT
ELECTRODE REM PT RTRN 9FT ADLT (ELECTROSURGICAL) ×2 IMPLANT
EVACUATOR SILICONE 100CC (DRAIN) ×4 IMPLANT
GAUZE PAD ABD 8X10 STRL (GAUZE/BANDAGES/DRESSINGS) ×4 IMPLANT
GAUZE XEROFORM 1X8 LF (GAUZE/BANDAGES/DRESSINGS) IMPLANT
GLOVE BIO SURGEON STRL SZ 6 (GLOVE) ×6 IMPLANT
GLOVE BIOGEL PI MICRO STRL 6 (GLOVE) IMPLANT
GOWN STRL REUS W/ TWL LRG LVL3 (GOWN DISPOSABLE) ×6 IMPLANT
KIT BASIN OR (CUSTOM PROCEDURE TRAY) ×2 IMPLANT
KIT TURNOVER KIT B (KITS) ×2 IMPLANT
MARKER SKIN DUAL TIP RULER LAB (MISCELLANEOUS) ×2 IMPLANT
NDL HYPO 25GX1X1/2 BEV (NEEDLE) ×2 IMPLANT
NEEDLE HYPO 25GX1X1/2 BEV (NEEDLE) ×1
NS IRRIG 1000ML POUR BTL (IV SOLUTION) ×2 IMPLANT
PACK GENERAL/GYN (CUSTOM PROCEDURE TRAY) ×2 IMPLANT
PIN SAFETY STERILE (MISCELLANEOUS) ×2 IMPLANT
SPONGE T-LAP 18X18 ~~LOC~~+RFID (SPONGE) ×4 IMPLANT
STAPLER VISISTAT 35W (STAPLE) ×2 IMPLANT
STRIP CLOSURE SKIN 1/2X4 (GAUZE/BANDAGES/DRESSINGS) IMPLANT
SUT DVC VLOC 180 0 12IN GS21 (SUTURE)
SUT ETHILON 2 0 FS 18 (SUTURE) ×4 IMPLANT
SUT MNCRL AB 4-0 PS2 18 (SUTURE) ×4 IMPLANT
SUT PDS AB 0 CT1 27 (SUTURE) ×8 IMPLANT
SUT PLAIN 5 0 P 3 18 (SUTURE) IMPLANT
SUT STRATA PDS 2-0 30 CT-1.5 (SUTURE) ×5
SUTURE DVC VLC 180 0 12IN GS21 (SUTURE) IMPLANT
SUTURE STRAT PDS 2-0 30 CT-1.5 (SUTURE) IMPLANT
TOWEL GREEN STERILE FF (TOWEL DISPOSABLE) ×2 IMPLANT
UNDERPAD 30X36 HEAVY ABSORB (UNDERPADS AND DIAPERS) ×4 IMPLANT
WATER STERILE IRR 1000ML POUR (IV SOLUTION) ×2 IMPLANT

## 2023-02-19 NOTE — Transfer of Care (Signed)
Immediate Anesthesia Transfer of Care Note  Patient: Alicia Horne  Procedure(s) Performed: PANNICULECTOMY (Abdomen)  Patient Location: PACU  Anesthesia Type:General  Level of Consciousness: awake, alert , and oriented  Airway & Oxygen Therapy: Patient Spontanous Breathing and Patient connected to nasal cannula oxygen  Post-op Assessment: Report given to RN and Post -op Vital signs reviewed and stable  Post vital signs: Reviewed and stable  Last Vitals:  Vitals Value Taken Time  BP 153/75 02/19/23 1023  Temp    Pulse 75 02/19/23 1025  Resp 17 02/19/23 1025  SpO2 97 % 02/19/23 1025  Vitals shown include unfiled device data.  Last Pain:  Vitals:   02/19/23 0606  PainSc: 0-No pain         Complications: No notable events documented.

## 2023-02-19 NOTE — Anesthesia Procedure Notes (Signed)
Procedure Name: Intubation Date/Time: 02/19/2023 7:35 AM  Performed by: Susy Manor, CRNAPre-anesthesia Checklist: Patient identified, Emergency Drugs available, Suction available and Patient being monitored Patient Re-evaluated:Patient Re-evaluated prior to induction Oxygen Delivery Method: Circle System Utilized Preoxygenation: Pre-oxygenation with 100% oxygen Induction Type: IV induction Ventilation: Mask ventilation without difficulty Laryngoscope Size: Mac and 3 Grade View: Grade I Tube type: Oral Tube size: 7.5 mm Number of attempts: 1 Airway Equipment and Method: Stylet and Oral airway Placement Confirmation: ETT inserted through vocal cords under direct vision, positive ETCO2 and breath sounds checked- equal and bilateral Secured at: 23 cm Tube secured with: Tape Dental Injury: Teeth and Oropharynx as per pre-operative assessment

## 2023-02-19 NOTE — Progress Notes (Signed)
Dilaudid .5mg  iv wasted in steri cycle. Witnessed by [employer]

## 2023-02-19 NOTE — Anesthesia Postprocedure Evaluation (Signed)
Anesthesia Post Note  Patient: MAYIM SHAFF  Procedure(s) Performed: PANNICULECTOMY (Abdomen)     Patient location during evaluation: PACU Anesthesia Type: General Level of consciousness: awake and alert Pain management: pain level controlled Vital Signs Assessment: post-procedure vital signs reviewed and stable Respiratory status: spontaneous breathing, nonlabored ventilation, respiratory function stable and patient connected to nasal cannula oxygen Cardiovascular status: blood pressure returned to baseline and stable Postop Assessment: no apparent nausea or vomiting Anesthetic complications: no  No notable events documented.  Last Vitals:  Vitals:   02/19/23 1100 02/19/23 1108  BP: (!) 158/64 (!) 156/57  Pulse: 71 70  Resp: 17 18  Temp:  36.5 C  SpO2: 93% 93%    Last Pain:  Vitals:   02/19/23 1108  PainSc: 0-No pain                 Denali Becvar S

## 2023-02-19 NOTE — Op Note (Signed)
Operative Note   DATE OF OPERATION: 12.27.2024  LOCATION: [facility] Main OR-outpatient  SURGICAL DIVISION: Plastic Surgery  PREOPERATIVE DIAGNOSES:  1. Panniculitis 2. History bariatric surgery  POSTOPERATIVE DIAGNOSES:  same  PROCEDURE:  Panniculectomy  SURGEON: Glenna Fellows MD MBA  ASSISTANT: Tarry Kos RNFA  ANESTHESIA:  General.   EBL: 25 ml  COMPLICATIONS: None immediate.   INDICATIONS FOR PROCEDURE:  The patient, Alicia Horne, is a 57 y.o. 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   FINDINGS: Abdominal soft tissue resection 2899 g  DESCRIPTION OF PROCEDURE:  The patient's caudal incision was marked 7 cm cephalad to anterior fourchette. This was caudal to her prior low transverse scar in this area. Incision was extended over lateral abdomen. The patient was taken to the operating room. SCDs were placed and IV antibiotics, TXA were given. The patient's operative site was prepped and draped in a sterile fashion. A time out was performed and all information was confirmed to be correct.     Low transverse abdominal incision carried through superficial fascia to abdominal wall. Skin flap elevated in sub Scarpa's layer, taking care to leave layer of subfascial fat over abdominal wall fascia. Dissection completed toward umbilicus. Umbilicus sharply incised and scissor dissection completed to free from abdominal skin flap. Additional dissection completed in midline toward xiphoid. Wound irrigated and hemostasis obtained. Local anesthetic infiltrated. Caudal extent skin excision marked by palpation. Area marked excised. 19 Fr JP placed in subcutaneous right and left abdomen and secured with 2-0 nylon. Superiorly based U shaped skin flap incised for delivery umbilicus. Low transverse abdominal skin incision closed with 0 PDS interrupted in superficial fascia. 2-0 Strattafix used to close dermis and 4-0 monocryl for  subcuticular skin closure. The prior location of umbilicus as closed primarily in midline resulting in vertical scar. Umbilicus inset with 4-0 monocryl in dermis and 5-0 plain gut for skin closure. Xeroform bolster placed within umbilicus. Steri strips applied to low transverse incision.   Dry dressing and abdominal binder applied. The patient was allowed to wake from anesthesia, extubated and taken to the recovery room in satisfactory condition  SPECIMENS: none  DRAINS: 19 Fr JP in right and left subcutaneous abdomen  Glenna Fellows, MD [facility] Plastic & Reconstructive Surgery  Office/ physician access line after hours [phone]

## 2023-02-19 NOTE — Interval H&P Note (Signed)
History and Physical Interval Note:  02/19/2023 6:54 AM  [place]  has presented today for surgery, with the diagnosis of Panniculitis history bariatric surgery.  The various methods of treatment have been discussed with the patient and family. After consideration of risks, benefits and other options for treatment, the patient has consented to  Procedure(s): PANNICULECTOMY (N/A) as a surgical intervention.  The patient's history has been reviewed, patient examined, no change in status, stable for surgery.  I have reviewed the patient's chart and labs.  Questions were answered to the patient's satisfaction.     Irean Hong Siren Porrata

## 2023-02-20 ENCOUNTER — Encounter (HOSPITAL_COMMUNITY): Payer: Self-pay | Admitting: Plastic Surgery
